# Patient Record
Sex: Female | Born: 1965 | Race: Black or African American | Hispanic: No | Marital: Married | Smoking: Never smoker
Health system: Southern US, Community
[De-identification: ages and names within clinical notes are randomized; demographics above are authoritative.]

## PROBLEM LIST (undated history)

## (undated) DIAGNOSIS — C50919 Malignant neoplasm of unspecified site of unspecified female breast: Secondary | ICD-10-CM

## (undated) DIAGNOSIS — I10 Essential (primary) hypertension: Secondary | ICD-10-CM

## (undated) DIAGNOSIS — D4860 Neoplasm of uncertain behavior of unspecified breast: Secondary | ICD-10-CM

## (undated) DIAGNOSIS — Z923 Personal history of irradiation: Secondary | ICD-10-CM

## (undated) HISTORY — DX: Malignant neoplasm of unspecified site of unspecified female breast: C50.919

## (undated) HISTORY — DX: Essential (primary) hypertension: I10

---

## 1998-05-25 ENCOUNTER — Encounter: Payer: Self-pay | Admitting: Obstetrics and Gynecology

## 1998-05-25 ENCOUNTER — Inpatient Hospital Stay (HOSPITAL_COMMUNITY): Admission: AD | Admit: 1998-05-25 | Discharge: 1998-05-25 | Payer: Self-pay | Admitting: Obstetrics and Gynecology

## 1998-06-02 ENCOUNTER — Inpatient Hospital Stay (HOSPITAL_COMMUNITY): Admission: AD | Admit: 1998-06-02 | Discharge: 1998-06-02 | Payer: Self-pay | Admitting: Obstetrics and Gynecology

## 1998-06-06 ENCOUNTER — Encounter: Payer: Self-pay | Admitting: Obstetrics and Gynecology

## 1998-06-06 ENCOUNTER — Inpatient Hospital Stay (HOSPITAL_COMMUNITY): Admission: AD | Admit: 1998-06-06 | Discharge: 1998-06-06 | Payer: Self-pay | Admitting: Obstetrics and Gynecology

## 1998-06-12 ENCOUNTER — Inpatient Hospital Stay (HOSPITAL_COMMUNITY): Admission: AD | Admit: 1998-06-12 | Discharge: 1998-06-12 | Payer: Self-pay | Admitting: *Deleted

## 1998-06-16 ENCOUNTER — Inpatient Hospital Stay (HOSPITAL_COMMUNITY): Admission: AD | Admit: 1998-06-16 | Discharge: 1998-06-19 | Payer: Self-pay | Admitting: Obstetrics and Gynecology

## 2000-05-19 ENCOUNTER — Other Ambulatory Visit: Admission: RE | Admit: 2000-05-19 | Discharge: 2000-05-19 | Payer: Self-pay | Admitting: *Deleted

## 2002-06-28 ENCOUNTER — Other Ambulatory Visit: Admission: RE | Admit: 2002-06-28 | Discharge: 2002-06-28 | Payer: Self-pay | Admitting: *Deleted

## 2005-07-22 ENCOUNTER — Encounter: Admission: RE | Admit: 2005-07-22 | Discharge: 2005-07-22 | Payer: Self-pay | Admitting: Obstetrics and Gynecology

## 2006-03-06 ENCOUNTER — Inpatient Hospital Stay (HOSPITAL_COMMUNITY): Admission: AD | Admit: 2006-03-06 | Discharge: 2006-03-10 | Payer: Self-pay | Admitting: *Deleted

## 2006-03-06 ENCOUNTER — Encounter (INDEPENDENT_AMBULATORY_CARE_PROVIDER_SITE_OTHER): Payer: Self-pay | Admitting: *Deleted

## 2006-03-11 ENCOUNTER — Encounter: Admission: RE | Admit: 2006-03-11 | Discharge: 2006-04-09 | Payer: Self-pay | Admitting: Obstetrics and Gynecology

## 2006-04-10 ENCOUNTER — Encounter: Admission: RE | Admit: 2006-04-10 | Discharge: 2006-05-10 | Payer: Self-pay | Admitting: Obstetrics and Gynecology

## 2006-05-11 ENCOUNTER — Encounter: Admission: RE | Admit: 2006-05-11 | Discharge: 2006-06-10 | Payer: Self-pay | Admitting: Obstetrics and Gynecology

## 2006-06-11 ENCOUNTER — Encounter: Admission: RE | Admit: 2006-06-11 | Discharge: 2006-07-10 | Payer: Self-pay | Admitting: Obstetrics and Gynecology

## 2006-07-11 ENCOUNTER — Encounter: Admission: RE | Admit: 2006-07-11 | Discharge: 2006-08-10 | Payer: Self-pay | Admitting: Obstetrics and Gynecology

## 2006-08-11 ENCOUNTER — Encounter: Admission: RE | Admit: 2006-08-11 | Discharge: 2006-09-10 | Payer: Self-pay | Admitting: Obstetrics and Gynecology

## 2009-01-09 ENCOUNTER — Other Ambulatory Visit: Admission: RE | Admit: 2009-01-09 | Discharge: 2009-01-09 | Payer: Self-pay | Admitting: Family Medicine

## 2009-01-17 ENCOUNTER — Encounter: Admission: RE | Admit: 2009-01-17 | Discharge: 2009-01-17 | Payer: Self-pay | Admitting: Family Medicine

## 2011-02-15 NOTE — Op Note (Signed)
Stacey Hinton, Stacey Hinton              ACCOUNT NO.:  [id number]   MEDICAL RECORD NO.:  [id number]          PATIENT TYPE:  INP   LOCATION:  9304                          FACILITY:  [facility]   PHYSICIAN:  Gerri Spore B. Earlene Plater, M.D.  DATE OF BIRTH:  January 07, 1966   DATE OF PROCEDURE:  03/06/2006  DATE OF DISCHARGE:                                 OPERATIVE REPORT   PREOPERATIVE DIAGNOSIS:  32-week pregnancy, probable abruption and  hemolysis, elevated liver enzymes, and low platelet count syndrome.   POSTOPERATIVE DIAGNOSIS:  32-week pregnancy, probable abruption and  hemolysis, elevated liver enzymes, and low platelet count syndrome.   PROCEDURE:  Repeat low transverse cesarean section.   SURGEON:  Chester Holstein. Earlene Plater, M.D.   ASSISTANT:  Marlinda Mike, C.N.M.   ANESTHESIA:  Spinal.   SPECIMENS:  Placenta to pathology.   ESTIMATED BLOOD LOSS:  600.   COMPLICATIONS:  None.   FINDINGS:  3 pound 13 ounce female in vertex presentation.  Apgars 1, 8, and  8.  The pH was 7.29.   INDICATIONS FOR PROCEDURE:  The patient presented to maternity admissions  this morning with complaints of low back pain intermittently and associated  bleeding.  She was noted to have a blood pressure of 220/110.  This was not  able to be reduced despite 40 and then 80 mg of IV Labetalol.  In addition,  her liver function tests were elevated, with her AST and ALT in the 90s.  Platelets were normal.  I recommended delivery.  We discussed the risks of  prematurity and surgical risks as outlined in the history and physical.   DESCRIPTION OF PROCEDURE:  The patient was taken to the operating room and  spinal anesthesia obtained.  She was prepped and draped in the standard  fashion, and a Foley catheter was inserted into the bladder.   A Pfannenstiel incision was made through the previous scar and carried  sharply to the fascia.  The fascia was divided sharply.  The posterior  sheath and peritoneum were elevated and entered  sharply.  The rectus muscles  were very tight and adherent to the abdominal wall.  Therefore, the left  rectus muscle was divided in its medial third with the Bovie.  Hemostasis  was obtained.  The bladder blade was inserted.  The bladder flap was created  with sharp dissection.  The uterine incision was made in low transverse  fashion with the knife, with clear fluid at amniotomy.  The incision was  extended laterally and superiorly at the lateral edges.  It was dif to  deliver the vertex given the small fetal size and tight maternal abdomen;  however, with additional uterine extension superiorly on the left edge, the  vertex was delivered.  The nose and mouth were suctioned with the bulb.  The  remainder of the infant delivered without difficulty.  A small clot,  approximately 10 cc, was noted at the inferior edge of the placenta.  The  placenta was removed manually.  The uterus was exteriorized and cleared of  all clots and debris.  The uterine incision was closed  in a running locked  stitch of 0 chromic.  A second layer was placed in an imbricating fashion.  A bleeder at the left edge was made hemostatic with additional figure-of-  eight stitch of the same suture.  The tubes and ovaries appeared normal.  There were a few scattered subcentimeter subserosal fibroids noted.   The uterus was returned to the abdomen.  The pelvis was irrigated.  The  uterus and bladder flap were hemostatic.  The rectus muscle was also  hemostatic.  The fascia was closed in a running stitch of 0 Vicryl.  The  subcutaneous tissue was irrigated and made hemostatic with the Bovie.  The  skin was closed with staples.   The patient tolerated the procedure well.  There were no complications.  She  was taken to the recovery room awake, alert, and in stable condition.  All  counts were correct per the operating room staff.      Gerri Spore B. Earlene Plater, M.D.  Electronically Signed     WBD/MEDQ  D:  03/06/2006  T:   03/06/2006  Job:  [id number]

## 2011-02-15 NOTE — Discharge Summary (Signed)
NAME:  Stacey Hinton, Stacey Hinton       ACCOUNT NO.:  .......................Marland Kitchen   MEDICAL RECORD NO.:  [id number]          PATIENT TYPE:  INP   LOCATION:  9318                          FACILITY:  [facility]   PHYSICIAN:  Gerri Spore B. Earlene Plater, M.D.  DATE OF BIRTH:  04/29/1966   DATE OF ADMISSION:  03/06/2006  DATE OF DISCHARGE:  03/10/2006                                 DISCHARGE SUMMARY   ADMISSION DIAGNOSES:  1.  A 32-week intrauterine pregnancy.  2.  Preeclampsia with HELLP syndrome.  3.  Abruptio placenta.   DISCHARGE DIAGNOSES:  1.  A 32-week intrauterine pregnancy.  2.  Preeclampsia with HELLP syndrome.  3.  Abruptio placenta.   OPERATION/PROCEDURE:  Repeat low transverse cesarean section.   HISTORY OF PRESENT ILLNESS:  A 45 year old African American female, gravida  2, para 1, [redacted] weeks pregnant, presented with complaints of low back pain and  associated bleeding.  Blood pressure at the time of admission 220/110.  This  did not respond to several doses of IV labetalol.  Also liver function tests  were elevated with an AST and ALT in the 90s with normal platelet counts.  I  recommended immediate delivery to the patient.   HOSPITAL COURSE:  The patient was admitted, underwent repeat low transverse  cesarean section with findings of a 3 pound 13 ounce female, vertex  presentation.  Apgars were 1 and 8, pH 7.29.  Findings at the time of  surgery also included a small clot, about 10 mL at the inferior edge of the  placenta.  Postoperatively, the patient was placed on magnesium sulfate for  24 hours.  She received prophylaxis, given a nicardipine drip for blood  pressure control and converted to oral labetalol.  She did diurese and labs  normalized throughout her hospital stay.  She was discharged home on the  fourth postoperative day in satisfactory condition.   DISCHARGE INSTRUCTIONS:  Standard preprinted instructions given prior to  dismissal.  In addition, the patient was given preeclamptic  precautions.   FAMILY HISTORY:  [facility] in one week.   DISCHARGE MEDICATIONS:  1.  Labetalol 100 mg p.o. b.i.d.  2.  Prenatal vitamin one p.o. daily.   CONDITION ON DISCHARGE:  Satisfactory.      Gerri Spore B. Earlene Plater, M.D.  Electronically Signed     WBD/MEDQ  D:  03/31/2006  T:  03/31/2006  Job:  [id number]

## 2011-05-28 ENCOUNTER — Other Ambulatory Visit: Payer: Self-pay | Admitting: Obstetrics and Gynecology

## 2011-05-28 DIAGNOSIS — Z1231 Encounter for screening mammogram for malignant neoplasm of breast: Secondary | ICD-10-CM

## 2011-06-06 ENCOUNTER — Ambulatory Visit
Admission: RE | Admit: 2011-06-06 | Discharge: 2011-06-06 | Disposition: A | Discharge status: Home / Self Care | Payer: BC Managed Care – PPO | Source: Ambulatory Visit | Attending: Obstetrics and Gynecology | Admitting: Obstetrics and Gynecology

## 2011-06-06 DIAGNOSIS — Z1231 Encounter for screening mammogram for malignant neoplasm of breast: Secondary | ICD-10-CM

## 2011-06-07 ENCOUNTER — Other Ambulatory Visit: Payer: Self-pay | Admitting: Obstetrics and Gynecology

## 2011-06-07 DIAGNOSIS — R928 Other abnormal and inconclusive findings on diagnostic imaging of breast: Secondary | ICD-10-CM

## 2011-06-14 ENCOUNTER — Other Ambulatory Visit: Payer: Self-pay | Admitting: Obstetrics and Gynecology

## 2011-06-14 ENCOUNTER — Ambulatory Visit
Admission: RE | Admit: 2011-06-14 | Discharge: 2011-06-14 | Disposition: A | Discharge status: Home / Self Care | Payer: BC Managed Care – PPO | Source: Ambulatory Visit | Attending: Obstetrics and Gynecology | Admitting: Obstetrics and Gynecology

## 2011-06-14 DIAGNOSIS — R928 Other abnormal and inconclusive findings on diagnostic imaging of breast: Secondary | ICD-10-CM

## 2011-08-28 ENCOUNTER — Other Ambulatory Visit: Payer: Self-pay | Admitting: Obstetrics and Gynecology

## 2011-08-28 DIAGNOSIS — N63 Unspecified lump in unspecified breast: Secondary | ICD-10-CM

## 2011-09-05 ENCOUNTER — Other Ambulatory Visit: Payer: BC Managed Care – PPO

## 2011-09-09 ENCOUNTER — Ambulatory Visit
Admission: RE | Admit: 2011-09-09 | Discharge: 2011-09-09 | Disposition: A | Discharge status: Home / Self Care | Payer: BC Managed Care – PPO | Source: Ambulatory Visit | Attending: Obstetrics and Gynecology | Admitting: Obstetrics and Gynecology

## 2011-09-09 DIAGNOSIS — N63 Unspecified lump in unspecified breast: Secondary | ICD-10-CM

## 2012-09-07 ENCOUNTER — Other Ambulatory Visit: Payer: Self-pay | Admitting: Obstetrics and Gynecology

## 2012-09-07 DIAGNOSIS — Z1231 Encounter for screening mammogram for malignant neoplasm of breast: Secondary | ICD-10-CM

## 2012-10-12 ENCOUNTER — Ambulatory Visit
Admission: RE | Admit: 2012-10-12 | Discharge: 2012-10-12 | Disposition: A | Discharge status: Home / Self Care | Payer: BC Managed Care – PPO | Source: Ambulatory Visit | Attending: Obstetrics and Gynecology | Admitting: Obstetrics and Gynecology

## 2012-10-12 DIAGNOSIS — Z1231 Encounter for screening mammogram for malignant neoplasm of breast: Secondary | ICD-10-CM

## 2013-08-31 ENCOUNTER — Other Ambulatory Visit: Payer: Self-pay

## 2013-08-31 DIAGNOSIS — Z1231 Encounter for screening mammogram for malignant neoplasm of breast: Secondary | ICD-10-CM

## 2013-09-30 HISTORY — PX: BREAST LUMPECTOMY: SHX2

## 2013-10-13 ENCOUNTER — Ambulatory Visit: Payer: BC Managed Care – PPO

## 2013-10-14 ENCOUNTER — Ambulatory Visit
Admission: RE | Admit: 2013-10-14 | Discharge: 2013-10-14 | Disposition: A | Discharge status: Home / Self Care | Payer: BC Managed Care – PPO | Source: Ambulatory Visit

## 2013-10-14 DIAGNOSIS — Z1231 Encounter for screening mammogram for malignant neoplasm of breast: Secondary | ICD-10-CM

## 2014-02-09 ENCOUNTER — Ambulatory Visit (INDEPENDENT_AMBULATORY_CARE_PROVIDER_SITE_OTHER): Payer: BC Managed Care – PPO | Admitting: General Surgery

## 2014-02-09 ENCOUNTER — Encounter (INDEPENDENT_AMBULATORY_CARE_PROVIDER_SITE_OTHER): Payer: Self-pay

## 2014-02-09 ENCOUNTER — Encounter (INDEPENDENT_AMBULATORY_CARE_PROVIDER_SITE_OTHER): Payer: Self-pay | Admitting: General Surgery

## 2014-02-09 VITALS — BP 150/98 | HR 80 | Temp 97.6°F | Resp 12 | Ht 67.5 in | Wt 155.0 lb

## 2014-02-09 DIAGNOSIS — N63 Unspecified lump in unspecified breast: Secondary | ICD-10-CM

## 2014-02-09 NOTE — Progress Notes (Signed)
Patient ID: Stacey Hinton, female   DOB: April 06, 1966, 48 y.o.   MRN: [id number]  Chief Complaint  Patient presents with  . New Evaluation    eval rt breast mass    HPI Stacey Hinton is a 48 y.o. female.   HPI  She is referred by Dr. Ronita Hipps because of an enlarging right breast mass.  This is been present for a number of years. In 2012, a needle biopsy was performed and was consistent with fibroadenoma. The mass was 2.3 cm at that time. She is noticed it growing and  tenting up the skin in the right lateral breast.  No pain with this. No nipple discharge. Age at menarche was 21, age at first live birth was greater than 7, she is still having menstrual periods, no family history of breast cancer.  Past Medical History  Diagnosis Date  . Hypertension     Past Surgical History  Procedure Laterality Date  . Cesarean section  06/16/98 03/06/06    Family History  Problem Relation Age of Onset  . Cancer Maternal Aunt 109    breast    Social History History  Substance Use Topics  . Smoking status: Never Smoker   . Smokeless tobacco: Never Used  . Alcohol Use: Yes     Comment: occ    No Known Allergies  Current Outpatient Prescriptions  Medication Sig Dispense Refill  . benazepril (LOTENSIN) 5 MG tablet Take 5 mg by mouth daily.      Marland Kitchen losartan (COZAAR) 25 MG tablet Take 25 mg by mouth daily.       No current facility-administered medications for this visit.    Review of Systems Review of Systems  Constitutional: Negative.   HENT: Negative.   Respiratory: Negative.   Cardiovascular: Negative.   Gastrointestinal: Negative.   Genitourinary: Negative.   Neurological: Negative.   Hematological: Negative.     Blood pressure 150/98, pulse 80, temperature 97.6 F (36.4 C), temperature source Temporal, resp. rate 12, height 5' 7.5" (1.715 m), weight 155 lb (70.308 kg).  Physical Exam Physical Exam  Constitutional: She appears well-developed and well-nourished. No distress.   HENT:  Head: Normocephalic and atraumatic.  Eyes: No scleral icterus.  Neck: Neck supple.  Pulmonary/Chest:  Breast asymmetry is present with the left breast being larger than the right (should she states this is always been the case). There is a 7 cm x 7 cm right breast mass laterally with tenting up of the skin. No inflammatory changes. The left breast is without masses or suspicious skin changes.  Musculoskeletal:  No supraclavicular or axillary adenopathy   Lymphadenopathy:    She has no cervical adenopathy.  Neurological: She is alert.  Skin: Skin is warm and dry.  Psychiatric: She has a normal mood and affect. Her behavior is normal.    Data Reviewed Mammograms. Previous pathology report. Note from Dr. Ronita Hipps.  Assessment    Enlarging right breast mass which previously was proven to be a fibroadenoma 2-1/2 years ago. It is now tenting up the skin. It could be an enlarging fibroadenoma or a new process.     Plan    I want to schedule a large core needle biopsy for her in the office. I did recommend eventual removal however there is a good chance this could lead to cosmetic deformity which the may later need to be corrected with plastic surgery. We will schedule her to come back to the office to have the large core  needle biopsy done.        Rhunette Croft Amiera Herzberg 02/09/2014, 10:25 AM

## 2014-02-09 NOTE — Patient Instructions (Signed)
Please call us after you have looked at your schedule so we can schedule the needle biopsy.

## 2014-02-14 ENCOUNTER — Ambulatory Visit (INDEPENDENT_AMBULATORY_CARE_PROVIDER_SITE_OTHER): Payer: BC Managed Care – PPO | Admitting: General Surgery

## 2014-02-14 ENCOUNTER — Other Ambulatory Visit (INDEPENDENT_AMBULATORY_CARE_PROVIDER_SITE_OTHER): Payer: Self-pay | Admitting: General Surgery

## 2014-02-14 ENCOUNTER — Encounter (INDEPENDENT_AMBULATORY_CARE_PROVIDER_SITE_OTHER): Payer: Self-pay | Admitting: General Surgery

## 2014-02-14 VITALS — BP 138/80 | HR 72 | Resp 14 | Ht 67.5 in | Wt 157.8 lb

## 2014-02-14 DIAGNOSIS — N6310 Unspecified lump in the right breast, unspecified quadrant: Secondary | ICD-10-CM

## 2014-02-14 DIAGNOSIS — N63 Unspecified lump in unspecified breast: Secondary | ICD-10-CM

## 2014-02-14 NOTE — Patient Instructions (Signed)
Light activities for the rest of the day. Apply ice to area as much as possible. Call if you have heavy bleeding. Keep the bandage dry. May remove bandage in 2 days and get area wet.

## 2014-02-14 NOTE — Progress Notes (Signed)
She presents today for large core needle biopsy of the growing right breast mass. The area in the right breast that was tenting up the skin was sterilely prepped and anesthetized with Xylocaine. A small incision was made in the overlying skin using  # 11 blade scalpel. Using a large core needle 3 biopsies were taken. These were placed in formalin. Bleeding was controlled with direct pressure and a single 4-0 Monocryl stitch to close the skin incision. A bulky dressing was applied. She tolerated the procedure well. She was given discharge instructions and we will call her with the pathology results.

## 2014-02-18 ENCOUNTER — Encounter (INDEPENDENT_AMBULATORY_CARE_PROVIDER_SITE_OTHER): Payer: Self-pay | Admitting: General Surgery

## 2014-02-18 NOTE — Progress Notes (Signed)
Patient ID: Stacey Hinton, female   DOB: [birth year], 48 y.o.   MRN: [id number] I spoke with her regarding her pathology which is below.  I told her that this area needs to be removed and very likely leave a cosmetic deformity. I suggested a referral to a plastic surgeon preoperatively and she is in agreement with that. When she sees a Psychiatric nurse and talks about potential for reconstruction, we can then schedule her surgery  .Breast, right, needle core biopsy, tissue - BIPHASIC STROMAL EPITHELIAL LESION. - SEE COMMENT. Microscopic Comment Based on the tissue examined, the differential includes a fibroadenoma versus a phyllodes tumor. A phyllodes tumor is favored, however, excision is recommended for definitive characterization.

## 2014-02-22 ENCOUNTER — Other Ambulatory Visit (INDEPENDENT_AMBULATORY_CARE_PROVIDER_SITE_OTHER): Payer: Self-pay | Admitting: General Surgery

## 2014-02-22 DIAGNOSIS — D4860 Neoplasm of uncertain behavior of unspecified breast: Secondary | ICD-10-CM

## 2014-05-16 ENCOUNTER — Telehealth (INDEPENDENT_AMBULATORY_CARE_PROVIDER_SITE_OTHER): Payer: Self-pay

## 2014-05-16 ENCOUNTER — Encounter (INDEPENDENT_AMBULATORY_CARE_PROVIDER_SITE_OTHER): Payer: Self-pay | Admitting: General Surgery

## 2014-05-16 NOTE — Telephone Encounter (Signed)
Spoke with her and have put orders in for her surgery.  Will coordinate with Dr. [name].

## 2014-05-16 NOTE — Telephone Encounter (Signed)
Pt states that she was returning Dr Zella Richer phone call from Friday. Pt states that she has been to see Dr [name] and pt is ready to schedule sx at this point. Pt would like to speak with Dr Zella Richer. Informed pt that I would send Dr Zella Richer a message. Pt verbalized understanding

## 2014-05-16 NOTE — Progress Notes (Signed)
Patient ID: Stacey Hinton, female   DOB: [birth year], 48 y.o.   MRN: [id number] I spoke with her today.  She is ready to schedule her surgery-Right partial mastectomy(most likely quadrantectomy) with closure by Dr. [name].  She had some insurance issues earlier this summer but those have been resolved.  Her daughter's 16th birthday is in mid-September and she would like to schedule the operation after that.

## 2014-06-17 ENCOUNTER — Other Ambulatory Visit: Payer: Self-pay | Admitting: Plastic Surgery

## 2014-06-23 ENCOUNTER — Encounter (HOSPITAL_COMMUNITY): Payer: Self-pay

## 2014-06-24 ENCOUNTER — Ambulatory Visit (HOSPITAL_COMMUNITY)
Admission: RE | Admit: 2014-06-24 | Discharge: 2014-06-24 | Disposition: A | Discharge status: Home / Self Care | Payer: BC Managed Care – PPO | Source: Ambulatory Visit | Attending: Anesthesiology | Admitting: Anesthesiology

## 2014-06-24 ENCOUNTER — Encounter (HOSPITAL_COMMUNITY): Payer: Self-pay

## 2014-06-24 ENCOUNTER — Encounter (HOSPITAL_COMMUNITY)
Admission: RE | Admit: 2014-06-24 | Discharge: 2014-06-24 | Disposition: A | Discharge status: Home / Self Care | Payer: BC Managed Care – PPO | Source: Ambulatory Visit | Attending: Plastic Surgery | Admitting: Plastic Surgery

## 2014-06-24 DIAGNOSIS — I10 Essential (primary) hypertension: Secondary | ICD-10-CM | POA: Insufficient documentation

## 2014-06-24 DIAGNOSIS — N63 Unspecified lump in unspecified breast: Secondary | ICD-10-CM | POA: Insufficient documentation

## 2014-06-24 DIAGNOSIS — Z01818 Encounter for other preprocedural examination: Secondary | ICD-10-CM | POA: Diagnosis not present

## 2014-06-24 LAB — CBC WITH DIFFERENTIAL/PLATELET
Basophils Absolute: 0 10*3/uL (ref 0.0–0.1)
Basophils Relative: 0 % (ref 0–1)
EOS PCT: 2 % (ref 0–5)
Eosinophils Absolute: 0.1 10*3/uL (ref 0.0–0.7)
HEMATOCRIT: 36.7 % (ref 36.0–46.0)
HEMOGLOBIN: 12.4 g/dL (ref 12.0–15.0)
LYMPHS ABS: 2.3 10*3/uL (ref 0.7–4.0)
LYMPHS PCT: 43 % (ref 12–46)
MCH: 28 pg (ref 26.0–34.0)
MCHC: 33.8 g/dL (ref 30.0–36.0)
MCV: 82.8 fL (ref 78.0–100.0)
MONOS PCT: 8 % (ref 3–12)
Monocytes Absolute: 0.4 10*3/uL (ref 0.1–1.0)
Neutro Abs: 2.5 10*3/uL (ref 1.7–7.7)
Neutrophils Relative %: 47 % (ref 43–77)
Platelets: 329 10*3/uL (ref 150–400)
RBC: 4.43 MIL/uL (ref 3.87–5.11)
RDW: 13 % (ref 11.5–15.5)
WBC: 5.4 10*3/uL (ref 4.0–10.5)

## 2014-06-24 LAB — COMPREHENSIVE METABOLIC PANEL
ALK PHOS: 82 U/L (ref 39–117)
ALT: 11 U/L (ref 0–35)
ANION GAP: 11 (ref 5–15)
AST: 16 U/L (ref 0–37)
Albumin: 3.9 g/dL (ref 3.5–5.2)
BUN: 6 mg/dL (ref 6–23)
CO2: 26 meq/L (ref 19–32)
Calcium: 8.8 mg/dL (ref 8.4–10.5)
Chloride: 101 mEq/L (ref 96–112)
Creatinine, Ser: 0.54 mg/dL (ref 0.50–1.10)
GFR calc non Af Amer: >90 mL/min (ref >90)
GLUCOSE: 88 mg/dL (ref 70–99)
POTASSIUM: 3.6 meq/L — AB (ref 3.7–5.3)
Sodium: 138 mEq/L (ref 137–147)
TOTAL PROTEIN: 7.7 g/dL (ref 6.0–8.3)
Total Bilirubin: 0.2 mg/dL — ABNORMAL LOW (ref 0.3–1.2)

## 2014-06-24 LAB — PROTIME-INR
INR: 1.1 (ref 0.00–1.49)
Prothrombin Time: 14.2 seconds (ref 11.6–15.2)

## 2014-06-24 LAB — HCG, SERUM, QUALITATIVE: PREG SERUM: NEGATIVE

## 2014-06-24 NOTE — Pre-Procedure Instructions (Addendum)
Stacey Hinton  06/24/2014   Your procedure is scheduled on:  Monday October 5.  Report to [facility] Admitting at 5:30AM.  Call this number if you have problems the morning of surgery: [phone]   Remember:   Do not eat food or drink liquids after midnight Sunday, October 4.  Take these medicines the morning of surgery with A SIP OF WATER: amLODipine (NORVASC).              Stop taking Aspirin, Coumadin, Plavix, Effient and Herbal medications.  Don not take any NSAIDs ie: Ibuprofen,  Advil,Naproxen or any medication containing Aspirin.   Do not wear jewelry, make-up or nail polish.  Do not wear lotions, powders, or perfumes.  Do not shave 48 hours prior to surgery.                                                                                                                              Do not bring valuables to the hospital.              [facility] is not responsible for any belongings or valuables.               Contacts, dentures or bridgework may not be worn into surgery.  Leave suitcase in the car. After surgery it may be brought to your room.  For patients admitted to the hospital, discharge time is determined by your treatment team.               Patients discharged the day of surgery will not be allowed to drivhome.  Name and phone number of your driver: :   Special Instructions: Stop taking Aspirin, Coumadin, Plavix, Effient and Herbal medications.  Do not take any NSAIDs ie: Ibuprofen,  Advil,Naproxen or any medication containing Aspirin.   Please read over the following fact sheets that you were given: Pain Booklet, Coughing and Deep Breathing and Surgical Site Infection Prevention

## 2014-07-03 MED ORDER — CHLORHEXIDINE GLUCONATE 4 % EX LIQD
1.0000 "application " | Freq: Once | CUTANEOUS | Status: DC
  Filled 2014-07-03: qty 15

## 2014-07-03 MED ORDER — CEFAZOLIN SODIUM-DEXTROSE 2-3 GM-% IV SOLR
2.0000 g | INTRAVENOUS | Status: AC
  Administered 2014-07-04 (×2): 2 g via INTRAVENOUS
  Filled 2014-07-03: qty 50

## 2014-07-03 MED ORDER — HEPARIN SODIUM (PORCINE) 5000 UNIT/ML IJ SOLN
5000.0000 [IU] | Freq: Once | INTRAMUSCULAR | Status: AC
  Administered 2014-07-04: 5000 [IU] via SUBCUTANEOUS
  Filled 2014-07-03: qty 1

## 2014-07-04 ENCOUNTER — Inpatient Hospital Stay (HOSPITAL_COMMUNITY)
Admission: RE | Admit: 2014-07-04 | Discharge: 2014-07-06 | DRG: 583 | Disposition: A | Discharge status: Home / Self Care | Payer: BC Managed Care – PPO | Source: Ambulatory Visit | Attending: Plastic Surgery | Admitting: Plastic Surgery

## 2014-07-04 ENCOUNTER — Encounter (HOSPITAL_COMMUNITY)
Admission: RE | Disposition: A | Discharge status: Home / Self Care | Payer: Self-pay | Source: Ambulatory Visit | Attending: Plastic Surgery

## 2014-07-04 ENCOUNTER — Encounter (HOSPITAL_COMMUNITY): Payer: Self-pay | Admitting: Surgery

## 2014-07-04 ENCOUNTER — Encounter (HOSPITAL_COMMUNITY): Payer: BC Managed Care – PPO | Admitting: Certified Registered Nurse Anesthetist

## 2014-07-04 ENCOUNTER — Ambulatory Visit (HOSPITAL_COMMUNITY): Payer: BC Managed Care – PPO | Admitting: Certified Registered Nurse Anesthetist

## 2014-07-04 DIAGNOSIS — I10 Essential (primary) hypertension: Secondary | ICD-10-CM | POA: Diagnosis present

## 2014-07-04 DIAGNOSIS — D4861 Neoplasm of uncertain behavior of right breast: Principal | ICD-10-CM | POA: Diagnosis present

## 2014-07-04 DIAGNOSIS — D4860 Neoplasm of uncertain behavior of unspecified breast: Secondary | ICD-10-CM

## 2014-07-04 DIAGNOSIS — N63 Unspecified lump in unspecified breast: Secondary | ICD-10-CM | POA: Diagnosis present

## 2014-07-04 HISTORY — DX: Neoplasm of uncertain behavior of unspecified breast: D48.60

## 2014-07-04 HISTORY — PX: LATISSIMUS FLAP TO BREAST: SHX5357

## 2014-07-04 HISTORY — PX: MASTECTOMY, PARTIAL: SHX709

## 2014-07-04 SURGERY — RECONSTRUCTION, BREAST, USING LATISSIMUS DORSI MYOCUTANEOUS FLAP
Anesthesia: General | Site: Breast | Laterality: Right

## 2014-07-04 MED ORDER — FENTANYL CITRATE 0.05 MG/ML IJ SOLN
INTRAMUSCULAR | Status: AC
  Filled 2014-07-04: qty 5

## 2014-07-04 MED ORDER — INFLUENZA VAC SPLIT QUAD 0.5 ML IM SUSY
0.5000 mL | PREFILLED_SYRINGE | INTRAMUSCULAR | Status: AC
  Administered 2014-07-05: 0.5 mL via INTRAMUSCULAR
  Filled 2014-07-04: qty 0.5

## 2014-07-04 MED ORDER — LIDOCAINE HCL (CARDIAC) 20 MG/ML IV SOLN
INTRAVENOUS | Status: DC | PRN
  Administered 2014-07-04: 60 mg via INTRAVENOUS

## 2014-07-04 MED ORDER — DIPHENHYDRAMINE HCL 50 MG/ML IJ SOLN: 12.5000 mg | Freq: Four times a day (QID) | INTRAMUSCULAR | Status: DC | PRN

## 2014-07-04 MED ORDER — HYDROMORPHONE 0.3 MG/ML IV SOLN
INTRAVENOUS | Status: AC
  Filled 2014-07-04: qty 25

## 2014-07-04 MED ORDER — 0.9 % SODIUM CHLORIDE (POUR BTL) OPTIME
TOPICAL | Status: DC | PRN
  Administered 2014-07-04 (×3): 1000 mL

## 2014-07-04 MED ORDER — PROMETHAZINE HCL 25 MG/ML IJ SOLN: 6.2500 mg | INTRAMUSCULAR | Status: DC | PRN

## 2014-07-04 MED ORDER — LACTATED RINGERS IV SOLN
INTRAVENOUS | Status: DC | PRN
  Administered 2014-07-04 (×4): via INTRAVENOUS

## 2014-07-04 MED ORDER — ESMOLOL HCL 10 MG/ML IV SOLN
INTRAVENOUS | Status: DC | PRN
  Administered 2014-07-04 (×2): 10 mg via INTRAVENOUS

## 2014-07-04 MED ORDER — HYDROMORPHONE HCL 1 MG/ML IJ SOLN
0.2500 mg | INTRAMUSCULAR | Status: DC | PRN
  Administered 2014-07-04 (×2): 0.5 mg via INTRAVENOUS

## 2014-07-04 MED ORDER — PROPOFOL 10 MG/ML IV BOLUS
INTRAVENOUS | Status: AC
  Filled 2014-07-04: qty 20

## 2014-07-04 MED ORDER — ROCURONIUM BROMIDE 50 MG/5ML IV SOLN
INTRAVENOUS | Status: AC
  Filled 2014-07-04: qty 1

## 2014-07-04 MED ORDER — AMLODIPINE BESYLATE 5 MG PO TABS
5.0000 mg | ORAL_TABLET | Freq: Every day | ORAL | Status: DC
  Administered 2014-07-05 – 2014-07-06 (×2): 5 mg via ORAL
  Filled 2014-07-04 (×3): qty 1

## 2014-07-04 MED ORDER — MIDAZOLAM HCL 5 MG/5ML IJ SOLN
INTRAMUSCULAR | Status: DC | PRN
  Administered 2014-07-04: 2 mg via INTRAVENOUS

## 2014-07-04 MED ORDER — GLYCOPYRROLATE 0.2 MG/ML IJ SOLN
INTRAMUSCULAR | Status: DC | PRN
  Administered 2014-07-04: 0.4 mg via INTRAVENOUS

## 2014-07-04 MED ORDER — HYDROMORPHONE HCL 1 MG/ML IJ SOLN
INTRAMUSCULAR | Status: AC
  Filled 2014-07-04: qty 1

## 2014-07-04 MED ORDER — EPHEDRINE SULFATE 50 MG/ML IJ SOLN
INTRAMUSCULAR | Status: AC
  Filled 2014-07-04: qty 1

## 2014-07-04 MED ORDER — ONDANSETRON HCL 4 MG/2ML IJ SOLN: 4.0000 mg | Freq: Four times a day (QID) | INTRAMUSCULAR | Status: DC | PRN

## 2014-07-04 MED ORDER — HYDROMORPHONE 0.3 MG/ML IV SOLN
INTRAVENOUS | Status: DC
  Administered 2014-07-04: 0.4 mg via INTRAVENOUS
  Administered 2014-07-04: 0.399 mg via INTRAVENOUS
  Administered 2014-07-04: 15:00:00 via INTRAVENOUS
  Administered 2014-07-05: 0.4 mg via INTRAVENOUS

## 2014-07-04 MED ORDER — NEOSTIGMINE METHYLSULFATE 10 MG/10ML IV SOLN
INTRAVENOUS | Status: DC | PRN
Start: 2014-07-04 — End: 2014-07-04
  Administered 2014-07-04: 3 mg via INTRAVENOUS

## 2014-07-04 MED ORDER — ONDANSETRON HCL 4 MG/2ML IJ SOLN
INTRAMUSCULAR | Status: DC | PRN
  Administered 2014-07-04: 4 mg via INTRAVENOUS

## 2014-07-04 MED ORDER — OXYCODONE HCL 5 MG/5ML PO SOLN: 5.0000 mg | Freq: Once | ORAL | Status: DC | PRN

## 2014-07-04 MED ORDER — SODIUM CHLORIDE 0.9 % IV SOLN
INTRAVENOUS | Status: AC
  Administered 2014-07-04: 09:00:00
  Filled 2014-07-04: qty 1

## 2014-07-04 MED ORDER — ONDANSETRON HCL 4 MG/2ML IJ SOLN
INTRAMUSCULAR | Status: AC
  Filled 2014-07-04: qty 2

## 2014-07-04 MED ORDER — HEPARIN SODIUM (PORCINE) 5000 UNIT/ML IJ SOLN
5000.0000 [IU] | Freq: Three times a day (TID) | INTRAMUSCULAR | Status: DC
  Administered 2014-07-05 – 2014-07-06 (×4): 5000 [IU] via SUBCUTANEOUS
  Filled 2014-07-04 (×8): qty 1

## 2014-07-04 MED ORDER — FENTANYL CITRATE 0.05 MG/ML IJ SOLN
INTRAMUSCULAR | Status: DC | PRN
  Administered 2014-07-04: 100 ug via INTRAVENOUS
  Administered 2014-07-04 (×5): 50 ug via INTRAVENOUS
  Administered 2014-07-04: 100 ug via INTRAVENOUS
  Administered 2014-07-04: 50 ug via INTRAVENOUS

## 2014-07-04 MED ORDER — METHOCARBAMOL 500 MG PO TABS
500.0000 mg | ORAL_TABLET | Freq: Four times a day (QID) | ORAL | Status: DC | PRN
  Administered 2014-07-05 – 2014-07-06 (×2): 500 mg via ORAL
  Filled 2014-07-04 (×2): qty 1

## 2014-07-04 MED ORDER — CEFAZOLIN SODIUM-DEXTROSE 2-3 GM-% IV SOLR
INTRAVENOUS | Status: AC
  Filled 2014-07-04: qty 50

## 2014-07-04 MED ORDER — CEFAZOLIN SODIUM 1-5 GM-% IV SOLN
1.0000 g | Freq: Four times a day (QID) | INTRAVENOUS | Status: DC
  Administered 2014-07-04 – 2014-07-06 (×7): 1 g via INTRAVENOUS
  Filled 2014-07-04 (×9): qty 50

## 2014-07-04 MED ORDER — SUCCINYLCHOLINE CHLORIDE 20 MG/ML IJ SOLN
INTRAMUSCULAR | Status: AC
  Filled 2014-07-04: qty 1

## 2014-07-04 MED ORDER — MIDAZOLAM HCL 2 MG/2ML IJ SOLN
INTRAMUSCULAR | Status: AC
  Filled 2014-07-04: qty 2

## 2014-07-04 MED ORDER — PHENYLEPHRINE HCL 10 MG/ML IJ SOLN
INTRAMUSCULAR | Status: DC | PRN
  Administered 2014-07-04 (×2): 40 ug via INTRAVENOUS
  Administered 2014-07-04: 80 ug via INTRAVENOUS
  Administered 2014-07-04: 40 ug via INTRAVENOUS
  Administered 2014-07-04: 80 ug via INTRAVENOUS

## 2014-07-04 MED ORDER — SODIUM CHLORIDE 0.9 % IJ SOLN
INTRAMUSCULAR | Status: AC
  Filled 2014-07-04: qty 10

## 2014-07-04 MED ORDER — DIPHENHYDRAMINE HCL 12.5 MG/5ML PO ELIX: 12.5000 mg | ORAL_SOLUTION | Freq: Four times a day (QID) | ORAL | Status: DC | PRN

## 2014-07-04 MED ORDER — OXYCODONE HCL 5 MG PO TABS: 5.0000 mg | ORAL_TABLET | Freq: Once | ORAL | Status: DC | PRN

## 2014-07-04 MED ORDER — DOCUSATE SODIUM 100 MG PO CAPS
100.0000 mg | ORAL_CAPSULE | Freq: Every day | ORAL | Status: DC
  Administered 2014-07-04 – 2014-07-06 (×3): 100 mg via ORAL
  Filled 2014-07-04 (×3): qty 1

## 2014-07-04 MED ORDER — NALOXONE HCL 0.4 MG/ML IJ SOLN: 0.4000 mg | INTRAMUSCULAR | Status: DC | PRN

## 2014-07-04 MED ORDER — LIDOCAINE HCL (CARDIAC) 20 MG/ML IV SOLN
INTRAVENOUS | Status: AC
  Filled 2014-07-04: qty 5

## 2014-07-04 MED ORDER — DEXTROSE-NACL 5-0.45 % IV SOLN
INTRAVENOUS | Status: DC
  Administered 2014-07-04 (×2): via INTRAVENOUS

## 2014-07-04 MED ORDER — SODIUM CHLORIDE 0.9 % IJ SOLN: 9.0000 mL | INTRAMUSCULAR | Status: DC | PRN

## 2014-07-04 MED ORDER — LOSARTAN POTASSIUM 50 MG PO TABS
50.0000 mg | ORAL_TABLET | Freq: Every day | ORAL | Status: DC
  Administered 2014-07-04 – 2014-07-06 (×3): 50 mg via ORAL
  Filled 2014-07-04 (×4): qty 1

## 2014-07-04 MED ORDER — PROPOFOL 10 MG/ML IV BOLUS
INTRAVENOUS | Status: DC | PRN
  Administered 2014-07-04: 50 mg via INTRAVENOUS
  Administered 2014-07-04: 200 mg via INTRAVENOUS
  Administered 2014-07-04: 100 mg via INTRAVENOUS

## 2014-07-04 MED ORDER — ROCURONIUM BROMIDE 100 MG/10ML IV SOLN
INTRAVENOUS | Status: DC | PRN
  Administered 2014-07-04: 50 mg via INTRAVENOUS
  Administered 2014-07-04: 30 mg via INTRAVENOUS
  Administered 2014-07-04 (×2): 10 mg via INTRAVENOUS

## 2014-07-04 SURGICAL SUPPLY — 98 items
ADH SKN CLS APL DERMABOND .7 (GAUZE/BANDAGES/DRESSINGS) ×1
APL SKNCLS STERI-STRIP NONHPOA (GAUZE/BANDAGES/DRESSINGS)
APPLIER CLIP 9.375 MED OPEN (MISCELLANEOUS) ×6
APR CLP MED 9.3 20 MLT OPN (MISCELLANEOUS) ×2
ATCH SMKEVC FLXB CAUT HNDSWH (FILTER) ×2 IMPLANT
BAG DECANTER FOR FLEXI CONT (MISCELLANEOUS) ×3 IMPLANT
BANDAGE ELASTIC 6 VELCRO ST LF (GAUZE/BANDAGES/DRESSINGS) ×1 IMPLANT
BENZOIN TINCTURE PRP APPL 2/3 (GAUZE/BANDAGES/DRESSINGS) ×1 IMPLANT
BINDER BREAST LRG (GAUZE/BANDAGES/DRESSINGS) ×2 IMPLANT
BINDER BREAST XLRG (GAUZE/BANDAGES/DRESSINGS) IMPLANT
BINDER BREAST XXLRG (GAUZE/BANDAGES/DRESSINGS) IMPLANT
BIOPATCH RED 1 DISK 7.0 (GAUZE/BANDAGES/DRESSINGS) ×5 IMPLANT
BIOPATCH RED 1IN DISK 7.0MM (GAUZE/BANDAGES/DRESSINGS) ×3
BLADE 10 SAFETY STRL DISP (BLADE) ×1 IMPLANT
BLADE CLIPPER SURG (BLADE) ×2 IMPLANT
BLADE SURG 10 STRL SS (BLADE) ×2 IMPLANT
BLADE SURG 15 STRL LF DISP TIS (BLADE) ×1 IMPLANT
BLADE SURG 15 STRL SS (BLADE) ×6
CANISTER SUCTION 2500CC (MISCELLANEOUS) ×3 IMPLANT
CHLORAPREP W/TINT 26ML (MISCELLANEOUS) ×6 IMPLANT
CLEANER TIP ELECTROSURG 2X2 (MISCELLANEOUS) ×3 IMPLANT
CLIP APPLIE 9.375 MED OPEN (MISCELLANEOUS) ×1 IMPLANT
CLOSURE WOUND 1/2 X4 (GAUZE/BANDAGES/DRESSINGS)
CONT SPEC 4OZ CLIKSEAL STRL BL (MISCELLANEOUS) IMPLANT
COVER SURGICAL LIGHT HANDLE (MISCELLANEOUS) ×6 IMPLANT
DECANTER SPIKE VIAL GLASS SM (MISCELLANEOUS) ×2 IMPLANT
DERMABOND ADVANCED (GAUZE/BANDAGES/DRESSINGS) ×2
DERMABOND ADVANCED .7 DNX12 (GAUZE/BANDAGES/DRESSINGS) ×3 IMPLANT
DEVICE DUBIN SPECIMEN MAMMOGRA (MISCELLANEOUS) IMPLANT
DRAIN CHANNEL 19F RND (DRAIN) ×8 IMPLANT
DRAPE INCISE 23X17 IOBAN STRL (DRAPES) ×4
DRAPE INCISE 23X17 STRL (DRAPES) ×1 IMPLANT
DRAPE INCISE IOBAN 23X17 STRL (DRAPES) ×2 IMPLANT
DRAPE ORTHO SPLIT 77X108 STRL (DRAPES) ×12
DRAPE PED LAPAROTOMY (DRAPES) ×1 IMPLANT
DRAPE PROXIMA HALF (DRAPES) ×6 IMPLANT
DRAPE SURG 17X23 STRL (DRAPES) ×4 IMPLANT
DRAPE SURG ORHT 6 SPLT 77X108 (DRAPES) ×4 IMPLANT
DRAPE UTILITY 15X26 W/TAPE STR (DRAPE) ×10 IMPLANT
DRAPE WARM FLUID 44X44 (DRAPE) ×3 IMPLANT
DRSG PAD ABDOMINAL 8X10 ST (GAUZE/BANDAGES/DRESSINGS) ×2 IMPLANT
DRSG SORBAVIEW 3.5X5-5/16 MED (GAUZE/BANDAGES/DRESSINGS) ×6 IMPLANT
DRSG TEGADERM 4X4.75 (GAUZE/BANDAGES/DRESSINGS) IMPLANT
ELECT BLADE 6.5 EXT (BLADE) ×2 IMPLANT
ELECT CAUTERY BLADE 6.4 (BLADE) ×4 IMPLANT
ELECT REM PT RETURN 9FT ADLT (ELECTROSURGICAL) ×3
ELECTRODE REM PT RTRN 9FT ADLT (ELECTROSURGICAL) ×2 IMPLANT
EVACUATOR SILICONE 100CC (DRAIN) ×8 IMPLANT
EVACUATOR SMOKE ACCUVAC VALLEY (FILTER) ×2
GAUZE SPONGE 4X4 12PLY STRL (GAUZE/BANDAGES/DRESSINGS) ×4 IMPLANT
GAUZE SPONGE 4X4 16PLY XRAY LF (GAUZE/BANDAGES/DRESSINGS) ×1 IMPLANT
GAUZE XEROFORM 5X9 LF (GAUZE/BANDAGES/DRESSINGS) IMPLANT
GLOVE BIO SURGEON STRL SZ7 (GLOVE) ×2 IMPLANT
GLOVE BIO SURGEON STRL SZ7.5 (GLOVE) ×6 IMPLANT
GLOVE BIOGEL PI IND STRL 7.0 (GLOVE) IMPLANT
GLOVE BIOGEL PI IND STRL 8 (GLOVE) ×3 IMPLANT
GLOVE BIOGEL PI INDICATOR 7.0 (GLOVE) ×12
GLOVE BIOGEL PI INDICATOR 8 (GLOVE) ×14
GLOVE ECLIPSE 8.0 STRL XLNG CF (GLOVE) ×7 IMPLANT
GLOVE SURG SS PI 6.0 STRL IVOR (GLOVE) ×4 IMPLANT
GLOVE SURG SS PI 7.0 STRL IVOR (GLOVE) ×10 IMPLANT
GOWN STRL REUS W/ TWL LRG LVL3 (GOWN DISPOSABLE) ×4 IMPLANT
GOWN STRL REUS W/ TWL XL LVL3 (GOWN DISPOSABLE) ×2 IMPLANT
GOWN STRL REUS W/TWL LRG LVL3 (GOWN DISPOSABLE) ×6
GOWN STRL REUS W/TWL XL LVL3 (GOWN DISPOSABLE) ×9
KIT BASIN OR (CUSTOM PROCEDURE TRAY) ×6 IMPLANT
KIT ROOM TURNOVER OR (KITS) ×4 IMPLANT
MARKER SKIN DUAL TIP RULER LAB (MISCELLANEOUS) ×4 IMPLANT
NDL HYPO 25GX1X1/2 BEV (NEEDLE) IMPLANT
NEEDLE HYPO 25GX1X1/2 BEV (NEEDLE) IMPLANT
NS IRRIG 1000ML POUR BTL (IV SOLUTION) ×7 IMPLANT
PACK GENERAL/GYN (CUSTOM PROCEDURE TRAY) ×3 IMPLANT
PACK SURGICAL SETUP 50X90 (CUSTOM PROCEDURE TRAY) ×3 IMPLANT
PAD ABD 8X10 STRL (GAUZE/BANDAGES/DRESSINGS) ×2 IMPLANT
PAD ARMBOARD 7.5X6 YLW CONV (MISCELLANEOUS) ×12 IMPLANT
PENCIL BUTTON HOLSTER BLD 10FT (ELECTRODE) ×6 IMPLANT
PREFILTER EVAC NS 1 1/3-3/8IN (MISCELLANEOUS) ×3 IMPLANT
SPONGE LAP 18X18 X RAY DECT (DISPOSABLE) ×8 IMPLANT
STAPLER VISISTAT 35W (STAPLE) ×7 IMPLANT
STRIP CLOSURE SKIN 1/2X4 (GAUZE/BANDAGES/DRESSINGS) ×1 IMPLANT
SUT MNCRL AB 3-0 PS2 18 (SUTURE) ×8 IMPLANT
SUT MNCRL AB 4-0 PS2 18 (SUTURE) ×2 IMPLANT
SUT MON AB 2-0 CT1 36 (SUTURE) ×2 IMPLANT
SUT MON AB 4-0 PC3 18 (SUTURE) ×1 IMPLANT
SUT PDS AB 0 CT 36 (SUTURE) ×6 IMPLANT
SUT PROLENE 3 0 PS 1 (SUTURE) ×8 IMPLANT
SUT VIC AB 3-0 FS2 27 (SUTURE) IMPLANT
SUT VIC AB 3-0 SH 18 (SUTURE) ×1 IMPLANT
SUT VIC AB 3-0 SH 8-18 (SUTURE) ×5 IMPLANT
SYR 50ML SLIP (SYRINGE) IMPLANT
SYR BULB IRRIGATION 50ML (SYRINGE) ×6 IMPLANT
SYR CONTROL 10ML LL (SYRINGE) IMPLANT
TOWEL OR 17X24 6PK STRL BLUE (TOWEL DISPOSABLE) ×6 IMPLANT
TOWEL OR 17X26 10 PK STRL BLUE (TOWEL DISPOSABLE) ×6 IMPLANT
TRAY FOLEY CATH 14FRSI W/METER (CATHETERS) ×1 IMPLANT
TUBE CONNECTING 12'X1/4 (SUCTIONS) ×3
TUBE CONNECTING 12X1/4 (SUCTIONS) ×5 IMPLANT
WATER STERILE IRR 1000ML POUR (IV SOLUTION) ×1 IMPLANT

## 2014-07-04 NOTE — Progress Notes (Signed)
I have seen and examined Stacey Hinton.  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  She presents today for excision and reconstruction.

## 2014-07-04 NOTE — Brief Op Note (Signed)
07/04/2014  1:27 PM  PATIENT:  Stacey Hinton  48 y.o. female  PRE-OPERATIVE DIAGNOSIS:  BREAST CANCER,PHYLLODES TUMOR OF RIGHT BREAST  POST-OPERATIVE DIAGNOSIS:  BREAST CANCER,PHYLLODES TUMOR OF RIGHT BREAST  PROCEDURE:  Procedure(s):  LATISSIMUS FLAP FOR RIGHT BREAST RECONSTRUCTION  (Right) RIGHT MASTECTOMY PARTIAL (Right) Rearrangement of tissue right chest SURGEON:  Surgeon(s) and Role: Panel 1:    * Jackolyn Confer, MD    * Crissie Reese, MD - Primary    * Crissie Reese, MD - Primary  Panel 2:    * Jackolyn Confer, MD - Primary  PHYSICIAN ASSISTANT:   ASSISTANTS: none   ANESTHESIA:   general  EBL:  Total I/O In: 3000 [I.V.:3000] Out: 575 [Urine:475; Blood:100]  BLOOD ADMINISTERED:none  DRAINS: (3) Jackson-Pratt drain(s) with closed bulb suction in the right back donor site (2) and right chest (1)   LOCAL MEDICATIONS USED:  NONE  SPECIMEN:  No Specimen  DISPOSITION OF SPECIMEN:  N/A  COUNTS:  YES  TOURNIQUET:  * No tourniquets in log *  DICTATION: .Other Dictation: Dictation Number [id number]  PLAN OF CARE: Admit to inpatient   PATIENT DISPOSITION:  PACU - hemodynamically stable.   Delay start of Pharmacological VTE agent (>24hrs) due to surgical blood loss or risk of bleeding: no

## 2014-07-04 NOTE — Anesthesia Postprocedure Evaluation (Signed)
  Anesthesia Post-op Note  Patient: Stacey Hinton  Procedure(s) Performed: Procedure(s):  LATISSIMUS FLAP FOR RIGHT BREAST RECONSTRUCTION  (Right) RIGHT MASTECTOMY PARTIAL (Right)  Patient Location: PACU  Anesthesia Type:General  Level of Consciousness: awake, alert  and oriented  Airway and Oxygen Therapy: Patient Spontanous Breathing and Patient connected to nasal cannula oxygen  Post-op Pain: mild  Post-op Assessment: Post-op Vital signs reviewed, Patient's Cardiovascular Status Stable, Respiratory Function Stable, Patent Airway and No signs of Nausea or vomiting  Post-op Vital Signs: Reviewed and stable  Last Vitals:  Filed Vitals:   07/04/14 1315  BP: 120/57  Pulse:   Temp: 36.6 C  Resp:     Complications: No apparent anesthesia complications

## 2014-07-04 NOTE — Transfer of Care (Signed)
Immediate Anesthesia Transfer of Care Note  Patient: Stacey Hinton  Procedure(s) Performed: Procedure(s):  LATISSIMUS FLAP FOR RIGHT BREAST RECONSTRUCTION  (Right) RIGHT MASTECTOMY PARTIAL (Right)  Patient Location: PACU  Anesthesia Type:General  Level of Consciousness: sedated, patient cooperative and responds to stimulation  Airway & Oxygen Therapy: Patient Spontanous Breathing and Patient connected to nasal cannula oxygen  Post-op Assessment: Report given to PACU RN and Post -op Vital signs reviewed and stable  Post vital signs: Reviewed and stable  Complications: No apparent anesthesia complications

## 2014-07-04 NOTE — Anesthesia Preprocedure Evaluation (Addendum)
Anesthesia Evaluation  Patient identified by MRN, date of birth, ID band Patient awake    Reviewed: Allergy & Precautions, H&P , NPO status , Patient's Chart, lab work & pertinent test results  Airway Mallampati: II  Neck ROM: full    Dental   Pulmonary neg pulmonary ROS,          Cardiovascular hypertension,     Neuro/Psych    GI/Hepatic   Endo/Other    Renal/GU      Musculoskeletal   Abdominal   Peds  Hematology   Anesthesia Other Findings   Reproductive/Obstetrics                          Anesthesia Physical Anesthesia Plan  ASA: II  Anesthesia Plan: General   Post-op Pain Management:    Induction: Intravenous  Airway Management Planned: Oral ETT  Additional Equipment:   Intra-op Plan:   Post-operative Plan: Extubation in OR  Informed Consent: I have reviewed the patients History and Physical, chart, labs and discussed the procedure including the risks, benefits and alternatives for the proposed anesthesia with the patient or authorized representative who has indicated his/her understanding and acceptance.     Plan Discussed with: CRNA, Anesthesiologist and Surgeon  Anesthesia Plan Comments:         Anesthesia Quick Evaluation

## 2014-07-04 NOTE — Op Note (Signed)
Operative Note  Stacey Hinton female 48 y.o. 07/04/2014  PREOPERATIVE DX:  Large phyllodes tumor of right breast  POSTOPERATIVE DX:  Same  PROCEDURE:   Right partial mastectomy (approximately 60%)         Surgeon: [name]   Assistants: Crissie Reese M.D.  Anesthesia: General endotracheal anesthesia  Indications:   [name] is a 48 year old female who noticed a mass in the right breast. In 2012 a needle biopsy was performed and was consistent with fibroadenoma. The mass was 2.3 cm at that time. The mass has become increasingly larger. It is involving at least 40-50% of the breast. Large core needle biopsy was performed and was consistent with a possible phyllodes tumor versus fibroadenoma. She now presents for the above procedure followed by reconstruction by Dr. [name]. The procedure and risks including possible recurrence were discussed with her.    Procedure Detail:  She was seen in the holding area in the right breast marked with my initials.  She was brought to the operating room placed supine on the operating table a general anesthetic was given.  The right breast and axillary areas were sterilely prepped and draped. A fishmouth type incision was made beginning at the 12:00 position in the circumareolar area and extending this to the 6:00 position. I then extended the incision did include a small ellipse of skin with a tumor was tenting up the skin. The incision was as extended linearly toward the right axilla. Using electrocautery, subcutaneous flaps were raised in all directions. I then separated the subcutaneous tissue from the mass trying to include normal tissue upon the mass. The mass went all the way down to the pectoralis fascia. Using electrocautery I then excised the mass. This involved approximately 60% of the breast tissue. The medial aspect of the mass was marked with 2 sutures, the anterior aspect was marked with a single suture. The mass weighed a little over  500 g.  The wound was then irrigated and bleeding was controlled electrocautery. Once hemostasis was adequate, an antibiotic soaked sponge was placed in the wound. The skin was closed with staples. Dr. [name] then proceeded with the reconstructive procedure. There were no apparent complications from the first part of the operation.  Estimated Blood Loss:  150 ml  Blood Given: none          Specimens: right breast mass        Complications:  * No complications entered in OR log *

## 2014-07-04 NOTE — H&P (Signed)
I have re-examined and re-evaluated the patient and their are no changes.  See office notes in paper chart for H&P.  Planned procedure: Excision right breast mass (Dr. Zella Richer) Reconstruction right breast with bat-wing or latissimus flap

## 2014-07-05 MED ORDER — HYDROMORPHONE HCL 2 MG PO TABS
2.0000 mg | ORAL_TABLET | ORAL | Status: DC | PRN
  Administered 2014-07-05: 4 mg via ORAL
  Administered 2014-07-05 (×2): 2 mg via ORAL
  Administered 2014-07-05 – 2014-07-06 (×4): 4 mg via ORAL
  Filled 2014-07-05 (×3): qty 2
  Filled 2014-07-05 (×2): qty 1
  Filled 2014-07-05 (×2): qty 2

## 2014-07-05 NOTE — Progress Notes (Signed)
UR completed.  Gracemarie Skeet, RN BSN [facility] CCM Trauma/Neuro ICU Case Manager [phone]  

## 2014-07-05 NOTE — Op Note (Signed)
NAMEMARTENA, Stacey Hinton NO.:  [id number]  MEDICAL RECORD NO.:  [id number]  LOCATION:  6N30C                        FACILITY:  [facility]  PHYSICIAN:  Crissie Reese, M.D.     DATE OF BIRTH:  26-Apr-1966  DATE OF PROCEDURE:  07/04/2014 DATE OF DISCHARGE:                              OPERATIVE REPORT   PREOPERATIVE DIAGNOSES: 1. Large right breast mass. 2. Phyllodes tumor of the right breast.  POSTOPERATIVE DIAGNOSES: 1. Large right breast mass. 2. Phyllodes tumor of the right breast.  PROCEDURES PERFORMED: 1. Right latissimus myocutaneous flap. 2. Distinct procedure adjacent tissue transfer of trunk for     repositioning of the right nipple-areolar complex.  SURGEON:  Crissie Reese, M.D.  ANESTHESIA:  General.  ESTIMATED BLOOD LOSS:  100 mL.  DRAINS:  One 19-French for the right anterior chest and two 19-French for the right back donor site.  CLINICAL NOTE:  A 48 year old woman has a very large tumor of her right breast upper outer quadrant that extends subareolar and into the upper inner quadrant and into the lower outer quadrant.  This has been evaluated and it is a tumor of undetermined behavior locally aggressive and it is felt that it does have elements of phyllodes.  It was clearly medically indicated to remove this large tumor.  The plan was a partial mastectomy by General Surgery which would leave the defect due to a tissue resection that would be on the order of 400-500 g of tissue. This would require reconstruction.  The plan was to spare the nipple- areolar complex if possible.  Various options were discussed with her for reconstruction.  One of these right latissimus flap and then also the possibility of a hemibatwing type of oncologic procedure.  It was felt, however, that latissimus tissue to be removed that it would necessitate the use of a flap for reconstruction.  The latissimus flap was discussed as well as TRAM flap for the reconstruction.   Having discussed both of these options, she preferred latissimus as opposed to the TRAM.  She understood there would be incisions around the nipple because some sort of flap involving the nipple to reposition would be necessary due to the paddle type of excision was planned by General Surgery.  The procedure was discussed as well as location of scar and photographs were shown and the risks were discussed include not limited to, bleeding, infection, healing problems, scarring, loss of sensation, fluid accumulations, anesthesia-related complications, loss of sensation in the nipple, contour deformities, loss of strength and/or range of motion in the right arm and shoulder, loss of tissue, loss of the flap, loss of the portions of the flap, and chronic pain, DVT, PE, asymmetry, and overall disappointment.  She understood all of this as well as possibility of secondary procedures and wished to proceed.  DESCRIPTION OF PROCEDURE:  The patient was marked in the holding area for the latissimus flap.  She was taken to the operating room and placed supine.  After successful induction of general anesthesia, Dr. Zella Richer, the general surgeon, performed the partial mastectomy.  I did assist with his procedure at his request.  Having concluded that, a lap sponge soaked with antibiotic  solution was placed in the wound.  The wound was stapled, closed, and then covered with impervious large Steri- Drape.  The patient was then turned into a left lateral decubitus position with an axillary roll and stabilized with a beanbag.  She was then prepped with ChloraPrep for her back and then Betadine for the right chest area having removed the large Steri-Drape in order to prep this wound in the field.  She was then draped with sterile drapes including impervious utility drapes.  The skin paddle was then incised in beveling superiorly and inferiorly in order to include as much adipose tissue as possible and a  much broader attachment of the flap at the level of the muscle then at the skin in order to preserve blood supply and volume.  The dissection was carried out down to the muscle and then to the medial, lateral, superior, and inferior borders of the muscle.  The subcutaneous tunnel was then created to the right chest. The muscle was then divided distally and then reflected in a cephalad direction dissecting from inferior to superior.  Meticulous hemostasis maintained using electrocautery and larger vessels were double and triple ligated with Ligaclips and divided and 1 larger perforating vessel was suture ligated with a 3-0 Vicryl suture and divided.  The flap had been mobilized.  Great care taken to avoid any damage to the thoracodorsal pedicle and long thoracic.  The flap was then passed to the right chest with a lap sponge having been removed.  That wound was again stapled, closed over the flap and the flap had excellent color and bright red bleeding in its periphery consistent with viability as it was being transferred.  The back was then irrigated thoroughly with saline and meticulous hemostasis with electrocautery.  Two 19-French drains positioned, brought through separate stab wounds inferoanteriorly and secured with 3-0 Prolene sutures.  Excellent hemostasis having been confirmed, the closure of the back with 0 PDS interrupted inverted deep sutures, 2-0 Monocryl interrupted inverted deep dermal sutures, and 3-0 Monocryl running subcuticular suture with a couple of reinforcing, 3-0 Prolene simple sutures for the skin at the central aspect where there would be maximum tension on the wound closure.  The Dermabond was applied.  Dry sterile dressing and a Biopatch with dry sterile impervious dressings for the drains.  She was then covered over her wound on the right chest with again a large Steri-Drape impervious and was then placed supine.  Once she had been positioned, the large  Steri-Drape was removed.  The chest was prepped with Betadine and draped with sterile drapes including impervious utility drapes.  The skin staples were removed.  The flap was inspected.  It was noted to have excellent color and bright red bleeding in its periphery consistent with viability.  The flap was de- epithelialized and excellent bleeding.  Irrigation and meticulous hemostasis with electrocautery.  A 19-French drain was positioned, brought through separate stab wound inferiorly and secured with a 3-0 Prolene suture.  The excellent hemostasis having been confirmed.  The insetting with 3-0 Vicryl simple interrupted sutures to inset the flap superiorly, medially, and inferiorly.  The flap was then buried in position.  A portion of the tissue superior nipple-areolar complex was then excised.  Nipple-areolar complex advanced as a flap to preserve its contour.  The hemostasis with electrocautery and the closure 3-0 Monocryl interrupted inverted deep dermal sutures and running 4-0 Monocryl subcuticular suture with insetting of the nipple-areolar complex in its position with 3-0 Monocryl interrupted  inverted deep dermal sutures and 4-0 Monocryl running subcuticular suture.  Dermabond was applied.  Biopatch dry sterile and the impervious dressing to the drain and she was placed in the breast vest and transferred to the recovery room in stable having tolerated the procedure well.     Crissie Reese, M.D.     DB/MEDQ  D:  07/04/2014  T:  07/05/2014  Job:  [id number]

## 2014-07-05 NOTE — Progress Notes (Signed)
Feeling okay.  We discussed my part of the surgery. Reconstruction looks good.  Pathology pending.

## 2014-07-05 NOTE — Progress Notes (Signed)
Subjective: Good pain control. Slight nausea last night has resolved.Taking fluids well.  Objective: Vital signs in last 24 hours: Temp:  [97.8 F (36.6 C)-98.5 F (36.9 C)] 98.2 F (36.8 C) (10/06 0525) Pulse Rate:  [72-87] 74 (10/06 0525) Resp:  [12-21] 16 (10/06 0525) BP: (105-170)/(57-85) 135/76 mmHg (10/06 0525) SpO2:  [96 %-100 %] 100 % (10/06 0525)  Intake/Output from previous day: 10/05 0701 - 10/06 0700 In: 5040 [P.O.:240; I.V.:4650; IV Piggyback:150] Out: 3010 [Urine:2675; Drains:235; Blood:100] Intake/Output this shift:    Operative sites: Mastectomy flaps viable. Right breast is soft. Flap is buried. Drains functioning. Drainage thin. No evidence of bleeding.  No results found for this basename: WBC, HGB, HCT, PLATELETS, NA, K, CL, CO2, BUN, CREATININE, GLU,  in the last 72 hours  Studies/Results: No results found.  Assessment/Plan: Ambulate. D/C foley. D/C PCA and begin po pain med.   LOS: 1 day    Stacey Hinton M 07/05/2014 7:04 AM

## 2014-07-06 MED ORDER — HYDROMORPHONE HCL 2 MG PO TABS: 2.0000 mg | ORAL_TABLET | ORAL | Status: DC | PRN

## 2014-07-06 MED ORDER — BACITRACIN-NEOMYCIN-POLYMYXIN 400-5-5000 EX OINT
TOPICAL_OINTMENT | CUTANEOUS | Status: AC
  Filled 2014-07-06: qty 1

## 2014-07-06 MED ORDER — METHOCARBAMOL 500 MG PO TABS: 500.0000 mg | ORAL_TABLET | Freq: Four times a day (QID) | ORAL | Status: DC

## 2014-07-06 MED ORDER — DSS 100 MG PO CAPS: 100.0000 mg | ORAL_CAPSULE | Freq: Every day | ORAL | Status: DC

## 2014-07-06 NOTE — Discharge Summary (Signed)
Physician Discharge Summary  Patient ID: Stacey Hinton MRN: [id number] DOB/AGE: Mar 28, 1966 48 y.o.  Admit date: 07/04/2014 Discharge date: 07/06/2014  Admission Diagnoses:Right breast mass. Phylloides tumor right breast  Discharge Diagnoses: Same Active Problems:   Breast mass   Discharged Condition: good  Hospital Course: On the day of admission the patient was taken to surgery and had right  Partial mastectomy and reconstruction with latissimus flap and nipple repositioning. The patient tolerated the procedures well. Postoperatively, the breast remained soft consistent with flap viability (it is a buried flap). The patient was ambulatory and tolerating diet on the first postoperative day. DVT prophylaxis and surgical antibiotic prophylaxis was continued while hospitalized.  Treatments: antibiotics: Ancef, anticoagulation: heparin and surgery: right partial mastectomy and reconstruction with latissimus flap.  Discharge Exam: Blood pressure 146/76, pulse 92, temperature 98 F (36.7 C), temperature source Oral, resp. rate 17, height 5' 7.5" (1.715 m), weight 158 lb (71.668 kg), last menstrual period 06/28/2014, SpO2 96.00%.  Operative sites: Nipple complex is viable. Right breast is soft. Drains functioning. Drainage thin. There is no evidence of bleeding or infection at either site.  Disposition: Home     Medication List         amLODipine 5 MG tablet  Commonly known as:  NORVASC  Take 5 mg by mouth daily.     DSS 100 MG Caps  Take 100 mg by mouth daily.     HYDROmorphone 2 MG tablet  Commonly known as:  DILAUDID  Take 1-2 tablets (2-4 mg total) by mouth every 4 (four) hours as needed for moderate pain.     losartan 50 MG tablet  Commonly known as:  COZAAR  Take 50 mg by mouth daily.     methocarbamol 500 MG tablet  Commonly known as:  ROBAXIN  Take 1 tablet (500 mg total) by mouth 4 (four) times daily.         SignedHarlow Mares, [name] 07/06/2014, 7:30 AM

## 2014-07-06 NOTE — Progress Notes (Signed)
Looks better today.  Pathology not back.  Will see her in office in 2-3 weeks.

## 2014-07-06 NOTE — Discharge Instructions (Addendum)
No lifting for 6 weeks No vigorous activity for 6 weeks (including outdoor walks) No driving for 4 weeks OK to walk up stairs slowly Stay propped up Use incentive spirometer at home every hour while awake No shower while drains are in place Empty drains at least three times a day and record the amounts separately Change drain dressings every third day if instructed to do so by Dr. [name] (no need to begin this yet)  Apply Bacitracin antibiotic ointment to the drain sites  Place gauze dressing over drains  Secure the gauze with tape Switch to sports bra for breast support Take an over-the-counter stool softener (such as Colace) while on pain medication See Dr. [name] in office on Monday For questions call [phone] or [phone]   Appointment with Dr. Zella Richer in 3 weeks.  Please call to make this appointment, [phone].

## 2014-07-06 NOTE — Progress Notes (Signed)
AVS discharge instructions were given and went over with patient and her husband. Patient and her husband were shown how to empty JP drains, how to measure it and record output. Also given written instructions on JP drain. Patient stated that she did not have any questions. Volunteers will assist to transportation when patient is ready.

## 2014-07-07 ENCOUNTER — Encounter (HOSPITAL_COMMUNITY): Payer: Self-pay | Admitting: Plastic Surgery

## 2014-07-13 ENCOUNTER — Encounter (INDEPENDENT_AMBULATORY_CARE_PROVIDER_SITE_OTHER): Payer: Self-pay | Admitting: General Surgery

## 2014-07-13 ENCOUNTER — Other Ambulatory Visit (INDEPENDENT_AMBULATORY_CARE_PROVIDER_SITE_OTHER): Payer: Self-pay | Admitting: *Deleted

## 2014-07-13 DIAGNOSIS — C50912 Malignant neoplasm of unspecified site of left female breast: Secondary | ICD-10-CM

## 2014-07-13 DIAGNOSIS — C50919 Malignant neoplasm of unspecified site of unspecified female breast: Secondary | ICD-10-CM

## 2014-07-13 NOTE — Progress Notes (Signed)
Patient ID: Stacey Hinton, female   DOB: March 24, 1966, 48 y.o.   MRN: [id number] Spoke with her about the discussion at the breast cancer conference this morning. No further surgery was recommended.  Will refer her to medical oncology and radiation oncology. We'll order a CT scan of the chest.

## 2014-07-14 ENCOUNTER — Encounter: Payer: Self-pay | Admitting: *Deleted

## 2014-07-14 NOTE — Progress Notes (Signed)
Location of Breast Cancer: right breast , 10:00 o'clock- phyllodes tumor, malignant  Histology per Pathology Report:  07/04/14 Diagnosis Breast, partial mastectomy, Right - MALIGNANT (HIGH GRADE) PHYLLODES TUMOR. SEE COMMENT. - TUMOR IS 2 MM FROM THE NEAREST MARGIN (POSTERIOR). Microscopic Comment Numerous representative sections of the 8.3 cm mass were reviewed. Slide sections demonstrate a malignant biphasic epithelial and stromal tumor with expansive stromal overgrowth demonstrating a malignant spindle cell population with significant cytologic atypia, mitotic activity (including atypical forms) and a focally  infiltrative border. No heterologous elements are present. The epithelium within the tumor is largely overgrown and replaced by the stromal proliferation. Where the epithelium is present, it does not demonstrate   significant cytoarchitectural atypia. The tumor is focally 85m from the posterior margin. The case was reviewed with Dr. KLyndon Codewho concurs.   Receptor Status: ER(), PR (), Her2-neu ()  Did patient present with symptoms (if so, please note symptoms) or was this found on screening mammography?: enlarging breast mass followed since 2012  Past/Anticipated interventions by surgeon, if any: 07/04/14 Dr RZella Richer right partial mastectomy, Dr BHarlow Mares right latissimus myocutaneous flap  Past/Anticipated interventions by medical oncology, if any: Chemotherapy , Dr MJana Hakim new consultation on 07/27/14  Lymphedema issues, if any:  no    Pain issues, if any:   Soreness of right breast at insertion site of 2 JP drains, Dilaudid prn  SAFETY ISSUES:  Prior radiation? no  Pacemaker/ICD? no  Possible current pregnancy?  last menses 06/28/14  Is the patient on methotrexate? no  Current Complaints / other details:  Menarche age [id number] birth age 48 C section x 2, P2, spontaneous ab x 1 married, paralegal    [place] MVerneita Griffes R[place]10/15/2015,11:15 AM

## 2014-07-15 ENCOUNTER — Ambulatory Visit
Admission: RE | Admit: 2014-07-15 | Discharge: 2014-07-15 | Disposition: A | Discharge status: Home / Self Care | Payer: BC Managed Care – PPO | Source: Ambulatory Visit | Attending: General Surgery | Admitting: General Surgery

## 2014-07-15 ENCOUNTER — Telehealth: Payer: Self-pay | Admitting: *Deleted

## 2014-07-15 DIAGNOSIS — C50912 Malignant neoplasm of unspecified site of left female breast: Secondary | ICD-10-CM

## 2014-07-15 MED ORDER — IOHEXOL 300 MG/ML  SOLN
75.0000 mL | Freq: Once | INTRAMUSCULAR | Status: AC | PRN
  Administered 2014-07-15: 75 mL via INTRAVENOUS

## 2014-07-15 NOTE — Telephone Encounter (Signed)
Received referral from [facility].  Called pt and confirmed 07/27/14 appt w/ her.  Mailed before appt letter, welcoming packet & intake form to pt.  Emailed Engineer, civil (consulting) at [employer] to make her aware.  Added to spreadsheet.

## 2014-07-18 ENCOUNTER — Encounter: Payer: Self-pay | Admitting: Radiation Oncology

## 2014-07-19 ENCOUNTER — Ambulatory Visit
Admission: RE | Admit: 2014-07-19 | Discharge: 2014-07-19 | Disposition: A | Discharge status: Home / Self Care | Payer: BC Managed Care – PPO | Source: Ambulatory Visit | Attending: Radiation Oncology | Admitting: Radiation Oncology

## 2014-07-19 ENCOUNTER — Encounter: Payer: Self-pay | Admitting: Radiation Oncology

## 2014-07-19 VITALS — BP 137/75 | HR 105 | Temp 98.0°F | Resp 18 | Ht 67.5 in | Wt 156.0 lb

## 2014-07-19 DIAGNOSIS — Z510 Encounter for antineoplastic radiation therapy: Secondary | ICD-10-CM | POA: Diagnosis present

## 2014-07-19 DIAGNOSIS — N63 Unspecified lump in unspecified breast: Secondary | ICD-10-CM

## 2014-07-19 DIAGNOSIS — C50911 Malignant neoplasm of unspecified site of right female breast: Secondary | ICD-10-CM

## 2014-07-19 HISTORY — DX: Malignant neoplasm of unspecified site of unspecified female breast: C50.919

## 2014-07-19 HISTORY — DX: Neoplasm of uncertain behavior of unspecified breast: D48.60

## 2014-07-19 NOTE — Progress Notes (Signed)
[facility] NEW PATIENT EVALUATION  Name: Stacey Hinton MRN: [id number]  Date:   07/19/2014           DOB: 29-Jan-1966  Status: outpatient   CC: Lilian Coma, MD  Jackolyn Confer, MD Dr. Crissie Reese, Dr. 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 female who is seen today through the courtesy Dr. Zella Richer for discussion of the role of radiation therapy in the management of her malignant phyllodes tumor of the right breast. In December of 2012 she underwent a right breast core biopsy which was diagnostic for "fibroadenoma". She was lost to followup and represented to Dr. Zella Richer this past May with a large mass within the upper outer quadrant of the right breast. She states that she first felt a mass approximately 1 year ago. Dr. Zella Richer appreciated a 7 x 7 cm mass. Along with Dr. [name], she underwent excision of the right breast mass (partial mastectomy) with a latissimus myocutaneous flap reconstruction. She is doing well postoperatively. On review of her pathology she was found to have malignant phyllodes tumor which was 2 mm from the posterior/deep margin.  PREVIOUS RADIATION THERAPY: No   PAST MEDICAL HISTORY:  has a past medical history of Hypertension; Phyllodes tumor (07/04/14); and Breast cancer.     PAST SURGICAL HISTORY:  Past Surgical History  Procedure Laterality Date  . Cesarean section  06/16/98, 03/06/06  . Mastectomy, partial Right 07/04/2014    WITH RECONSTRUCTION    DR [name]  . Latissimus flap to breast Right 07/04/2014    Procedure:  LATISSIMUS FLAP FOR RIGHT BREAST RECONSTRUCTION ;  Surgeon: Crissie Reese, MD;  Location: [facility];  Service: Plastics;  Laterality: Right;  . Mastectomy, partial Right 07/04/2014    Procedure: RIGHT MASTECTOMY PARTIAL;  Surgeon: Jackolyn Confer, MD;  Location: [facility];  Service: General;  Laterality: Right;     FAMILY HISTORY: family history includes Cancer (age of onset: 61) in her maternal aunt; Hypertension in her mother. Her mother is alive and well at age 48. Her father is 52, having had a stroke.   SOCIAL HISTORY:  reports that she has never smoked. She has never used smokeless tobacco. She reports that she drinks alcohol. She reports that she does not use illicit drugs. Married, 2 children, she works as a Engineer, production .   ALLERGIES: Review of patient's allergies indicates no known allergies.   MEDICATIONS:  Current Outpatient Prescriptions  Medication Sig Dispense Refill  . amLODipine (NORVASC) 5 MG tablet Take 5 mg by mouth daily.      Marland Kitchen HYDROmorphone (DILAUDID) 2 MG tablet Take 1-2 tablets (2-4 mg total) by mouth every 4 (four) hours as needed for moderate pain.  40 tablet  0  . losartan (COZAAR) 50 MG tablet Take 50 mg by mouth daily.      . methocarbamol (ROBAXIN) 500 MG tablet Take 1 tablet (500 mg total) by mouth 4 (four) times daily.  40 tablet  1   No current facility-administered medications for this encounter.     REVIEW OF SYSTEMS:  Pertinent items are noted in HPI.    PHYSICAL EXAM:  height is 5' 7.5" (1.715 m) and weight is 156 lb (70.761 kg). Her oral temperature is 98 F (36.7 C). Her blood pressure is 137/75 and her pulse is 105. Her  respiration is 18.   Alert and oriented 48 year old African American female appearing her stated age. Head and neck examination: Grossly unremarkable. Chest: There are surgical dressings along the right posterior lateral chest and below the right breast reconstruction with 2 Jackson-Pratt drains. Breasts: His surgical wounds along the right breast are healing well. Cosmesis appears to be excellent. Left breast without masses or lesions. Extremities: Without edema.   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 I performed a literature review, the best reference is from  "Up To Date".  There are also retrospective series reported from both [place] and the [place]. The risk for a local recurrence for patients with malignant phyllodes tumors is related to extent surgery, surgical margins, and tumor size. It is near impossible to predict this patient's risk for a local failure, but I would estimate it to be perhaps as high as 15-20% based on her 2 mm posterior margin. Radiation therapy has been shown to decrease the risk for local recurrence. We discussed 2 options; one would be having serial MRI scans of her right breast/chest wall least every 6 months for the next few years, and the other would be proceeding with radiation therapy to her reconstructed right breast/chest wall. We discussed the potential acute and late toxicities of radiation therapy. Ordinarily, latissimus myocutaneous flaps tolerate radiation therapy quite well. She will visit Dr. Jana Hakim next week. The patient can contact me if she wants to proceed with radiation therapy. I told her that I would wait  a minimum of 2-3 months before proceeding with radiation therapy to allow for complete healing if she wants to proceed with radiation therapy.   PLAN: As discussed above.  I spent 50  minutes face to face with the patient and more than 50% of that time was spent in counseling and/or coordination of care.

## 2014-07-19 NOTE — Addendum Note (Signed)
Encounter addended by: Andria Rhein, RN on: 07/19/2014 11:30 AM<BR>     Documentation filed: Charges VN

## 2014-07-19 NOTE — Progress Notes (Signed)
Please see the Nurse Progress Note in the MD Initial Consult Encounter for this patient. 

## 2014-07-20 ENCOUNTER — Ambulatory Visit: Payer: BC Managed Care – PPO | Admitting: Radiation Oncology

## 2014-07-20 ENCOUNTER — Encounter (INDEPENDENT_AMBULATORY_CARE_PROVIDER_SITE_OTHER): Payer: Self-pay | Admitting: General Surgery

## 2014-07-20 NOTE — Progress Notes (Unsigned)
Patient ID: Stacey Hinton, female   DOB: 10-19-1965, 48 y.o.   MRN: [id number]  Stacey Hinton 07/20/2014 10:09 AM Location: [facility] Patient #: [id number] DOB: 1966-05-21 Married / Language: English / Race: Black or African American Female History of Present Illness [name] MD; 07/20/2014 10:47 AM) Patient words: recheck path.  The patient is a 48 year old female    Note:Procedure: Right partial mastectomy and immediate reconstruction  Date: 07/04/14  Pathology: Malignant phyllodes tumor  History: She is here for her first postoperative visit. She is aware of her pathology. She has seen Dr. Valere Dross from radiation oncology and he has discussed options with her. She is scheduled to see Dr. Jana Hakim as well. She still has both drains in. She is seeing Dr. [name] later today. She is quite sore.  Exam: General- Is in NAD. Right breast-wound is clean and intact, two drains present with thin serous output  Other Problems Briant Cedar, [place]; 07/20/2014 10:10 AM) Breast Cancer  Past Surgical History Briant Cedar, [place]; 07/20/2014 10:10 AM) Breast Biopsy Right. Breast Mass; Local Excision Right. Breast Reconstruction Right. Cesarean Section - Multiple  Diagnostic Studies History Briant Cedar, [place]; 07/20/2014 10:10 AM) Colonoscopy never Mammogram within last year Pap Smear 1-5 years ago  Allergies Briant Cedar, [place]; 07/20/2014 10:10 AM) No Known Drug Allergies 07/20/2014  Medication History Briant Cedar, [place]; 07/20/2014 10:11 AM) HYDROmorphone HCl (2MG  Tablet, Oral) Active. Docusate Sodium (100MG  Capsule, Oral) Active. AmLODIPine Besylate (5MG  Tablet, Oral) Active. Losartan Potassium (50MG  Tablet, Oral) Active. Methocarbamol (500MG  Tablet, Oral) Active.  Social History Briant Cedar, [place]; 07/20/2014 10:10 AM) Alcohol use Occasional alcohol use. Caffeine use Carbonated beverages, Coffee, Tea. No drug  use Tobacco use Never smoker.  Family History Briant Cedar, [place]; 07/20/2014 10:10 AM) Hypertension Father, Mother.  Pregnancy / Birth History Briant Cedar, [place]; 07/20/2014 10:10 AM) Age at menarche 21 years. Contraceptive History Oral contraceptives. Gravida 2 Maternal age 21-35 Para 2 Regular periods     Review of Systems Briant Cedar CMA; 07/20/2014 10:10 AM) General Not Present- Appetite Loss, Chills, Fatigue, Fever, Night Sweats, Weight Gain and Weight Loss. Skin Not Present- Change in Wart/Mole, Dryness, Hives, Jaundice, New Lesions, Non-Healing Wounds, Rash and Ulcer. HEENT Present- Wears glasses/contact lenses. Not Present- Earache, Hearing Loss, Hoarseness, Nose Bleed, Oral Ulcers, Ringing in the Ears, Seasonal Allergies, Sinus Pain, Sore Throat, Visual Disturbances and Yellow Eyes. Respiratory Not Present- Bloody sputum, Chronic Cough, Difficulty Breathing, Snoring and Wheezing. Breast Not Present- Breast Mass, Breast Pain, Nipple Discharge and Skin Changes. Cardiovascular Not Present- Chest Pain, Difficulty Breathing Lying Down, Leg Cramps, Palpitations, Rapid Heart Rate, Shortness of Breath and Swelling of Extremities. Gastrointestinal Not Present- Abdominal Pain, Bloating, Bloody Stool, Change in Bowel Habits, Chronic diarrhea, Constipation, Difficulty Swallowing, Excessive gas, Gets full quickly at meals, Hemorrhoids, Indigestion, Nausea, Rectal Pain and Vomiting. Female Genitourinary Not Present- Frequency, Nocturia, Painful Urination, Pelvic Pain and Urgency. Musculoskeletal Not Present- Back Pain, Joint Pain, Joint Stiffness, Muscle Pain, Muscle Weakness and Swelling of Extremities. Neurological Not Present- Decreased Memory, Fainting, Headaches, Numbness, Seizures, Tingling, Tremor, Trouble walking and Weakness. Psychiatric Not Present- Anxiety, Bipolar, Change in Sleep Pattern, Depression, Fearful and Frequent crying. Endocrine Not Present- Cold  Intolerance, Excessive Hunger, Hair Changes, Heat Intolerance, Hot flashes and New Diabetes. Hematology Not Present- Easy Bruising, Excessive bleeding, Gland problems, HIV and Persistent Infections.  Vitals Briant Cedar CMA; 07/20/2014 10:14 AM) 07/20/2014 10:11 AM Weight: 157.13 lb Height: 67.5in Body Surface Area: 1.84 m Body Mass  Index: 24.25 kg/m Temp.: 98.34F  Pulse: 104 (Regular)  BP: 130/68 (Sitting, Left Arm, Standard)     Assessment & Plan [name] MD; 07/20/2014 10:48 AM)  MALIGNANT PHYLLODES TUMOR OF BREAST, RIGHT (174.9  C50.911) Impression: Assessment: Malignant phyllodes tumor of right breast status post partial mastectomy and reconstruction. Wound and reconstruction look good. All margins are clear, the posterior margin (chest wall) is 2 mm. She has talked with Dr. Valere Dross regarding radiation. She is due to see Dr. Jana Hakim as well.  Plan: Followup with me in one month.

## 2014-07-21 ENCOUNTER — Other Ambulatory Visit: Payer: Self-pay | Admitting: *Deleted

## 2014-07-21 ENCOUNTER — Encounter: Payer: Self-pay | Admitting: *Deleted

## 2014-07-21 DIAGNOSIS — C50919 Malignant neoplasm of unspecified site of unspecified female breast: Secondary | ICD-10-CM | POA: Insufficient documentation

## 2014-07-21 DIAGNOSIS — C50911 Malignant neoplasm of unspecified site of right female breast: Secondary | ICD-10-CM

## 2014-07-21 HISTORY — DX: Malignant neoplasm of unspecified site of unspecified female breast: C50.919

## 2014-07-22 ENCOUNTER — Ambulatory Visit: Payer: BC Managed Care – PPO

## 2014-07-22 ENCOUNTER — Ambulatory Visit: Payer: BC Managed Care – PPO | Admitting: Radiation Oncology

## 2014-07-27 ENCOUNTER — Ambulatory Visit (HOSPITAL_BASED_OUTPATIENT_CLINIC_OR_DEPARTMENT_OTHER): Payer: BC Managed Care – PPO | Admitting: Oncology

## 2014-07-27 ENCOUNTER — Ambulatory Visit: Payer: BC Managed Care – PPO

## 2014-07-27 ENCOUNTER — Encounter: Payer: Self-pay | Admitting: Oncology

## 2014-07-27 VITALS — BP 153/74 | HR 87 | Temp 98.1°F | Resp 18 | Ht 67.0 in | Wt 155.1 lb

## 2014-07-27 DIAGNOSIS — C50911 Malignant neoplasm of unspecified site of right female breast: Secondary | ICD-10-CM

## 2014-07-27 NOTE — Progress Notes (Signed)
[name]  Telephone:([phone]) [phone] Fax:(336) [phone]     ID: Stacey Hinton DOB: 06-18-1966  MR#: [id number]  OAC#:[id number]  Patient Care Team: Stacey Coma, MD as PCP - General (Family Medicine) Stacey Cruel, MD as Consulting Physician (Oncology) OTHER MD: Stacey Hinton, Arloa Koh, Crissie Reese  CHIEF COMPLAINT: Malignant phyllodes tumor  CURRENT TREATMENT: Awaiting adjuvant radiation   HISTORY OF PRESENT ILLNESS: The patient tells me Dr. Madaline Hinton noted a mass in her right breast during routine exam in the fall of 2012. He set her up for mammography and ultrasonography at the breast center, performed 06/14/2011. The palpable mass was in the outer right breast in compression views showed a partially circumscribed oval mass in that area. This was at the 9:00 position 6 cm from the nipple by physical exam. Ultrasound showed a circumscribed lobulated oval mass measuring 2.3 cm.  This was felt to be most likely a fibroadenoma and on 09/09/2011 this was confirmed by biopsy of the right breast mass, which showed ([id number]) a fibroadenoma with no evidence of malignancy.  The patient underwent a digital screening mammography 10/12/2012 and 10/14/2013 which showed no suspicious findings. The patient's breast density was noted to the category D. despite this, when Dr. table and examined the patient in February 2015 it seemed to him that the mass had grown some. The patient was referred to Dr. Lynwood Hinton for further evaluation and he confirmed that the patient's left breast was larger than the right and that there was a 7 cm right breast mass with tenting up of the skin, but without inflammatory changes. Biopsy of this mass was obtained 02/14/2014, and now it showed ([id number]) a biphasic stromal lesion favored to be a phyllodes tumor.  With this information and with the assistance of Dr. Harlow Hinton the patient underwent right partial mastectomy with immediate  latissimus flap and nipple repositioning on 07/04/2014. The pathology from that procedure ([id number]) showed a high-grade phyllodes tumor measuring 8.3 cm. There was expensive stromal overgrowth, significant cytologic atypia, increased mitotic activity and a focally infiltrative border. The tumor was focally 2 mm from the deep margin.   At the multidisciplinary breast cancer conference 07/13/2014 this case was discussed. The deep margin is down to muscle and therefore cannot be improved. Staging studies were suggested to rule out metastatic disease, and it was felt that radiation should be strongly considered. Follow-up with MRI was also discussed, since the patient's mammograms had been essentially noninformative.  The patient's subsequent history is as detailed below  INTERVAL HISTORY: Stacey Hinton was evaluated in the breast clinic 07/27/2014 accompanied by her husband Stacey Hinton.  REVIEW OF SYSTEMS: She did well with surgery, with no unusual bleeding, fever, or pain. She is much less sore now that the drains have been removed. Cosmetically she feels the reconstruction is "an A". She denies unusual headaches, visual changes, nausea, vomiting, stiff neck, cough, phlegm production, pleurisy, shortness of breath, chest pain or pressure, palpitations, or change in bowel or bladder habits. A detailed review of systems today was noncontributory  PAST MEDICAL HISTORY: Past Medical History  Diagnosis Date  . Hypertension   . 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 Breast cancer   . 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 Cesarean section  06/16/98, 03/06/06  . Mastectomy, partial Right 07/04/2014    WITH RECONSTRUCTION    DR Stacey Hinton  .  Latissimus flap to breast Right 07/04/2014    Procedure:  LATISSIMUS FLAP FOR RIGHT BREAST RECONSTRUCTION ;  Surgeon: Crissie Reese, MD;  Location: [facility];  Service: Plastics;   Laterality: Right;  . Mastectomy, partial Right 07/04/2014    Procedure: RIGHT MASTECTOMY PARTIAL;  Surgeon: Stacey Confer, MD;  Location: [facility];  Service: General;  Laterality: Right;    FAMILY HISTORY Family History  Problem Relation Age of Onset  . Cancer Maternal Aunt 60    breast  . Hypertension Mother    the patient's parents are still living. She is an only child. There is no history of cancer in the family to her knowledge  GYNECOLOGIC HISTORY:  Patient's last menstrual period was 06/28/2014. Menarche age 25, first live birth age 41, the patient is Stacey Hinton. She is still menstruating regularly. 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 Her husband Stacey Hinton is in the service department of a local company. Daughter Stacey Hinton, 45, and son  Stacey Hinton, 17, are at home.      ADVANCED DIRECTIVES: Not in place    HEALTH MAINTENANCE: History  Substance Use Topics  . Smoking status: Never Smoker   . Smokeless tobacco: Never Used  . Alcohol Use: Yes     Comment: occ     Colonoscopy: 2   PAP:  Bone density:  Lipid panel:  No Known Allergies  Current Outpatient Prescriptions  Medication Sig Dispense Refill  . amLODipine (NORVASC) 5 MG tablet Take 5 mg by mouth daily.      Marland Kitchen HYDROmorphone (DILAUDID) 2 MG tablet Take 1-2 tablets (2-4 mg total) by mouth every 50 (four) hours as needed for moderate pain.  40 tablet  0  . losartan (COZAAR) 50 MG tablet Take 50 mg by mouth daily.      . methocarbamol (ROBAXIN) 500 MG tablet Take 1 tablet (500 mg total) by mouth 4 (four) times daily.  40 tablet  1   No current facility-administered medications for this visit.    OBJECTIVE: middle-aged African-American woman who appears younger than stated age 48 Vitals:   07/27/14 1641  BP: 153/74  Pulse: 87  Temp: 98.1 F (36.7 C)  Resp: 18     Body mass index is 24.29 kg/(m^2).    ECOG FS:1 - Symptomatic but  completely ambulatory  Ocular: Sclerae unicteric, pupils equal, round and reactive to light Ear-nose-throat: Oropharynx clear, dentition in good repair Lymphatic: No cervical or supraclavicular adenopathy Lungs no rales or rhonchi, good excursion bilaterally Heart regular rate and rhythm, no murmur appreciated Abd soft, nontender, positive bowel sounds MSK no focal spinal tenderness, no upper extremity lymphedema Neuro: non-focal, well-oriented, positive affect Breasts: Right breast is status post recent lumpectomy and reconstruction. There is no evidence of dehiscence, erythema, or swelling. There is no evidence of residual disease by palpation or inspection. The cosmetic result is excellent. The left breast is unremarkable.   LAB RESULTS:  CMP     Component Value Date/Time   NA 138 06/24/2014 1358   K 3.6* 06/24/2014 1358   CL 101 06/24/2014 1358   CO2 26 06/24/2014 1358   GLUCOSE 88 06/24/2014 1358   BUN 6 06/24/2014 1358   CREATININE 0.54 06/24/2014 1358   CALCIUM 8.8 06/24/2014 1358   PROT 7.7 06/24/2014 1358   ALBUMIN 3.9 06/24/2014 1358   AST 16 06/24/2014 1358   ALT 11 06/24/2014 1358   ALKPHOS  82 06/24/2014 1358   BILITOT 0.2* 06/24/2014 1358   GFRNONAA >90 06/24/2014 1358   GFRAA >90 06/24/2014 1358    I No results found for this basename: SPEP,  UPEP,   kappa and lambda light chains    Lab Results  Component Value Date   WBC 5.4 06/24/2014   NEUTROABS 2.5 06/24/2014   HGB 12.4 06/24/2014   HCT 36.7 06/24/2014   MCV 82.8 06/24/2014   PLT 329 06/24/2014      Chemistry      Component Value Date/Time   NA 138 06/24/2014 1358   K 3.6* 06/24/2014 1358   CL 101 06/24/2014 1358   CO2 26 06/24/2014 1358   BUN 6 06/24/2014 1358   CREATININE 0.54 06/24/2014 1358      Component Value Date/Time   CALCIUM 8.8 06/24/2014 1358   ALKPHOS 82 06/24/2014 1358   AST 16 06/24/2014 1358   ALT 11 06/24/2014 1358   BILITOT 0.2* 06/24/2014 1358       No results found for this basename: LABCA2      No components found with this basename: LABCA125    No results found for this basename: INR,  in the last 168 hours  Urinalysis No results found for this basename: colorurine,  appearanceur,  labspec,  phurine,  glucoseu,  hgbur,  bilirubinur,  ketonesur,  proteinur,  urobilinogen,  nitrite,  leukocytesur    STUDIES: Ct Chest W Contrast  07/15/2014   CLINICAL DATA:  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  EXAM: CT CHEST WITH CONTRAST  TECHNIQUE: Multidetector CT imaging of the chest was performed during intravenous contrast administration.  CONTRAST:  56mL OMNIPAQUE IOHEXOL 300 MG/ML  SOLN  COMPARISON:  Chest CT dated 06/24/2014  FINDINGS: The patient has had right breast lumpectomy with breast reconstruction. There are postsurgical changes in the anterior and lateral aspects of the right side of the chest.  There is no adenopathy or mass.  There is no osseous abnormality.  Heart size and pulmonary vascularity are normal. No hilar or mediastinal adenopathy. No effusions. Tiny area of linear atelectasis at the right lung base posteriorly. The lungs are otherwise clear.  The visualized portion of the upper abdomen is normal.  IMPRESSION: 1. No evidence of metastatic disease. 2. Postsurgical changes in and around the right breast as described.   Electronically Signed   By: Rozetta Nunnery M.D.   On: 07/15/2014 15:55    ASSESSMENT: 48 y.o. [place] woman   (1) status post biopsy of a right upper quadrant breast mass 09/09/2011 showing fibroadenoma.  (2) biopsy of an enlarging mass in the right breast upper outer quadrant 02/14/2014 showed a biphasic stromal/epithelial lesion.  (3) status post right lumpectomy 07/04/2014 shows an 8.3 cm malignant phyllodes tumor with extensive stromal overgrowth, significant cytologic atypia, significant mitotic activity, and a focally infiltrative border. The closest margin (posterior) is 2 mm.  PLAN: I spent approximately one hour with the patient and  her husband today going over her situation. She understands while this cancer started in the breast it is not what we generally call a "breast cancer". Those are cancers of the glandular elements of the breast. This is a cancer off connective tissue in the breast and so is better called a sarcoma.  We discussed the fact that sarcomas are relatively rare tumors. Accordingly there is less data to guide [place] and all recommendations have a certain amount of uncertainty attached to them.  Nevertheless we know that these tumors generally  do not spread by way of the lymph nodes and therefore she did not need lymph node sampling. The tentative spread by way of the blood and particularly to the lungs and that is why she had a CT of the chest which fortunately does not show evidence of metastatic disease.  The patient has had risk both for systemic and local recurrence. We have no adjuvant treatment to prevent distant recurrence--we generally do not use chemotherapy in this setting. However I do recommend radiation to prevent a local recurrence. She understands we generally prefer margins of 1 cm or more, and her closest margin was only 2 mm. We cannot improve that margin as it is at the chest wall  Accordingly I quoted her a risk of local recurrence between 15 and 25% with observation alone. She can significantly cut back on that risk by receiving radiation. We discussed the possible toxicities, side effects of radiation and at this point she seems disposed to undergo it. I will make her a return appointment with Dr. Valere Dross in approximately 3 weeks by which time she should've recovered sufficiently from her surgery to consider starting therapy.  I will see her again in 3 months. At that point I will likely start yearly MRI surveillance for the next 5 years. She will have yearly mammography, of course, but she has category D breast density and mammograms are close to useless in this setting, as approved by her recent  history.  The patient has a good understanding of the overall plan. She agrees with it. She knows the goal of treatment in her case is cure. She will call with any problems that may develop before her next visit here.  Stacey Cruel, MD   07/27/2014 7:20 PM

## 2014-07-27 NOTE — Progress Notes (Signed)
Checked in new patient with no financial issues prior to seeing the d and she has [place] insurance. She has not been out of the country

## 2014-07-28 ENCOUNTER — Telehealth: Payer: Self-pay | Admitting: Oncology

## 2014-07-28 ENCOUNTER — Other Ambulatory Visit: Payer: Self-pay | Admitting: Emergency Medicine

## 2014-07-28 NOTE — Telephone Encounter (Signed)
s.w. pt and advised on all appts....Dr. Valere Dross on 11.5 @ 8am

## 2014-08-03 ENCOUNTER — Encounter: Payer: Self-pay | Admitting: *Deleted

## 2014-08-03 NOTE — Progress Notes (Signed)
[facility] Psychosocial Distress Screening Clinical Social Work  Clinical Social Work was referred by distress screening protocol.  The patient scored a 5 on the Psychosocial Distress Thermometer which indicates moderate distress. Clinical Social Worker contacted patient at home to assess for distress and other psychosocial needs.  Patient stated she was feeling "better" and recoverying well from her surgery.  CSw and patietn discussed her treament plan, and patient expressed some questions regarding upcoming appointment.  CSW shared questions and concerns with radiation RN who will follow up.  CSW and patient also discussed common emotional reactions to treatment.  CSW informed patient on the support team and support services at [facility], adn encouraged patient to call with questions or concerns.      Clinical Social Worker follow up needed: No.   ONCBCN DISTRESS SCREENING 07/19/2014  Screening Type Initial Screening  Distress experienced in past week (1-10) 5  Emotional problem type Adjusting to illness  Information Concerns Type Lack of info about diagnosis  Other "lack of information about my diagnosis" listed as most distressing   Johnnye Lana, MSW, LCSW, OSW-C Clinical Social Worker [place] [phone]

## 2014-08-03 NOTE — Progress Notes (Signed)
Location of Breast Cancer: right breast , 10:00 o'clock- phyllodes tumor, malignant  Histology per Pathology Report:  07/04/14 Diagnosis Breast, partial mastectomy, Right - MALIGNANT (HIGH GRADE) PHYLLODES TUMOR. SEE COMMENT. - TUMOR IS 2 MM FROM THE NEAREST MARGIN (POSTERIOR). Microscopic Comment Numerous representative sections of the 8.3 cm mass were reviewed. Slide sections demonstrate a malignant biphasic epithelial and stromal tumor with expansive stromal overgrowth demonstrating a malignant spindle cell population with significant cytologic atypia, mitotic activity (including atypical forms) and a focally infiltrative border. No heterologous elements are present. The epithelium within the tumor is largely overgrown and replaced by the stromal proliferation. Where the epithelium is present, it does not demonstrate significant cytoarchitectural atypia. The tumor is focally 66m from the posterior margin. The case was reviewed with Dr. KLyndon Codewho concurs.   Receptor Status: ER(), PR (), Her2-neu ()  Did patient present with symptoms (if so, please note symptoms) or was this found on screening mammography?: enlarging breast mass followed since 2012  Past/Anticipated interventions by surgeon, if any: 07/04/14 Dr RZella Richer right partial mastectomy, Dr BHarlow Mares right latissimus myocutaneous flap  Past/Anticipated interventions by medical oncology, if any: Chemotherapy , Dr MJana Hakim new consultation on 07/27/14  Lymphedema issues, if any: no   Pain issues, if any: Soreness of right breast at insertion site of 2 JP drains, Dilaudid prn  SAFETY ISSUES:  Prior radiation? no  Pacemaker/ICD? no  Possible current pregnancy? last menses 06/28/14  Is the patient on methotrexate? no  Current Complaints / other details: Menarche age 48 153stlive birth age 48 C section x 2, P2, spontaneous ab x 1 married, paralegal

## 2014-08-04 ENCOUNTER — Ambulatory Visit
Admission: RE | Admit: 2014-08-04 | Discharge: 2014-08-04 | Disposition: A | Discharge status: Home / Self Care | Payer: BC Managed Care – PPO | Source: Ambulatory Visit | Attending: Radiation Oncology | Admitting: Radiation Oncology

## 2014-08-04 ENCOUNTER — Ambulatory Visit: Payer: BC Managed Care – PPO

## 2014-08-15 ENCOUNTER — Encounter (INDEPENDENT_AMBULATORY_CARE_PROVIDER_SITE_OTHER): Payer: Self-pay | Admitting: General Surgery

## 2014-08-15 NOTE — Progress Notes (Signed)
Patient ID: Stacey Hinton, female   DOB: 12/23/1965, 48 y.o.   MRN: [id number]  Stacey Hinton 08/15/2014 1:43 PM Location: [facility] Patient #: [id number] DOB: 10-Sep-1966 Married / Language: English / Race: Black or African American Female History of Present Illness Stacey Hollingshead MD; 08/15/2014 2:01 PM) Patient words: post-op breast.  The patient is a 48 year old female    Note: Right partial mastectomy and immediate reconstruction  Date: 07/04/14  Pathology: Malignant phyllodes tumor  History: She is here for her second postoperative visit. She saw [name] who recommended XRT. She is going to start physical therapy. She is here with her husband.  Allergies (Stacey Hinton, CMA; 08/15/2014 1:43 PM) No Known Drug Allergies 07/20/2014  Medication History (Stacey Hinton, CMA; 08/15/2014 1:43 PM) HYDROmorphone HCl (2MG  Tablet, Oral) Active. Docusate Sodium (100MG  Capsule, Oral) Active. AmLODIPine Besylate (5MG  Tablet, Oral) Active. Losartan Potassium (50MG  Tablet, Oral) Active. Methocarbamol (500MG  Tablet, Oral) Active.    Vitals (Stacey Hinton CMA; 08/15/2014 1:45 PM) 08/15/2014 1:44 PM Weight: 153 lb Height: 67in Body Surface Area: 1.81 m Body Mass Index: 23.96 kg/m Temp.: 64F(Temporal)  Pulse: 79 (Regular)  BP: 134/80 (Sitting, Left Arm, Standard)     Physical Exam Stacey Hollingshead MD; 08/15/2014 2:02 PM)  The physical exam findings are as follows: Note:Right breast incisions is clean and intact, contour looks good.    Assessment & Plan Stacey Hollingshead MD; 08/15/2014 2:02 PM)  MALIGNANT PHYLLODES TUMOR OF BREAST, RIGHT (174.9  C50.911) Impression: Healing well and okay with starting XRT from my standpoint if that is what she and Stacey Hinton decide.  Plan: RTC in 6 months.  Stacey Hinton

## 2014-08-16 ENCOUNTER — Ambulatory Visit: Payer: BC Managed Care – PPO

## 2014-08-16 ENCOUNTER — Ambulatory Visit: Payer: BC Managed Care – PPO | Attending: Plastic Surgery | Admitting: Physical Therapy

## 2014-08-16 ENCOUNTER — Encounter: Payer: Self-pay | Admitting: Physical Therapy

## 2014-08-16 ENCOUNTER — Ambulatory Visit: Payer: BC Managed Care – PPO | Admitting: Radiation Oncology

## 2014-08-16 DIAGNOSIS — Z5189 Encounter for other specified aftercare: Secondary | ICD-10-CM | POA: Insufficient documentation

## 2014-08-16 DIAGNOSIS — C50911 Malignant neoplasm of unspecified site of right female breast: Secondary | ICD-10-CM | POA: Diagnosis not present

## 2014-08-16 DIAGNOSIS — M25611 Stiffness of right shoulder, not elsewhere classified: Secondary | ICD-10-CM

## 2014-08-16 NOTE — Therapy (Signed)
Physical Therapy Evaluation  Patient Details  Name: Stacey Hinton MRN: [id number] Date of Birth: [birth year]  Encounter Date: 08/16/2014      PT End of Session - 08/16/14 1456    Visit Number 1   Number of Visits 9   Date for PT Re-Evaluation 09/15/14   PT Start Time 1300   PT Stop Time 1354   PT Time Calculation (min) 54 min   Activity Tolerance Patient tolerated treatment well      Past Medical History  Diagnosis Date  . Hypertension   . 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 Breast cancer   . 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 Cesarean section  06/16/98, 03/06/06  . Mastectomy, partial Right 07/04/2014    WITH RECONSTRUCTION    DR [name]  . Latissimus flap to breast Right 07/04/2014    Procedure:  LATISSIMUS FLAP FOR RIGHT BREAST RECONSTRUCTION ;  Surgeon: Crissie Reese, MD;  Location: [place];  Service: Plastics;  Laterality: Right;  . Mastectomy, partial Right 07/04/2014    Procedure: RIGHT MASTECTOMY PARTIAL;  Surgeon: Jackolyn Confer, MD;  Location: [place];  Service: General;  Laterality: Right;    There were no vitals taken for this visit.  Visit Diagnosis:  Stiffness of joint, shoulder region, right - Plan: PT plan of care cert/re-cert      Subjective Assessment - 08/16/14 1309    Symptoms Dr. [name] sent me.  He was pleased with my being able to raise my arm up as much as I can, but I think it would be a good idea to see if it would help.   Pertinent History Right lumpectomy 07/04/14 with removal of about 60% of breast tissue removed; latissimus flap reconstruction at that time also.  No implant.  Pt. is currently deciding whether to have radiation.  No lymph nodes removed. Has had a negative CT scan.   Currently in Pain? No/denies          [facility] PT Assessment - 08/16/14 0001    Assessment   Medical Diagnosis right breast cancer with immediate lat flap reconstruction   Onset  Date 07/04/14   Next MD Visit December   Precautions   Precautions Other (comment)   Precaution Comments no massage; otherwise okay to do normal activities   Restrictions   Weight Bearing Restrictions No   Balance Screen   Has the patient fallen in the past 6 months No   Has the patient had a decrease in activity level because of a fear of falling?  No   Is the patient reluctant to leave their home because of a fear of falling?  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   Other Surveys  Select   AROM   Right Shoulder Extension 53 Degrees   Right Shoulder Flexion 123 Degrees   Right Shoulder ABduction 120 Degrees   Right Shoulder Internal Rotation 60 Degrees   Right Shoulder External Rotation 75 Degrees   Left Shoulder Extension 70 Degrees   Left Shoulder Flexion 149 Degrees   Left Shoulder ABduction 180 Degrees   Left  Shoulder Internal Rotation --  [facility]   Left Shoulder External Rotation 82 Degrees   PROM   Right Shoulder Flexion 142 Degrees   Right Shoulder ABduction 120 Degrees   Strength   Overall Strength Deficits   Overall Strength Comments Right shoulder 3-/5, left grossly 5/5            PT Education - 08/16/14 1455    Education provided Yes   Education Details cane exercises for shoulder flexion and abduction; external rotation on door jamb   Person(s) Educated Patient   Methods Explanation;Demonstration;Handout   Comprehension Returned demonstration;Verbalized understanding              Plan - 08/16/14 1459    Clinical Impression Statement Pt. with good but still limited post-op ROM (s/p lumpectomy with lat flap reconstruction on right side) will benefit from  guidance on a home exercise program and possibly hands-on therapy for improving ROM.   Pt will benefit from skilled therapeutic intervention in order to improve on the following deficits Decreased range of motion   Rehab Potential Excellent   PT Frequency 1x / week  May increase to 2x/week if pt. not independent with making gains   PT Duration 4 weeks   PT Treatment/Interventions Patient/family education;Therapeutic exercise;Manual techniques   PT Next Visit Plan Review current HEP and progress with additional ROM exercises; remeasure right shoulder ROM; manual therapy for right shoulder ROM.   PT Home Exercise Plan See instructions, education section.   Consulted and Agree with Plan of Care Patient        Problem List Patient Active Problem List   Diagnosis Date Noted  . 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                              Katina Dung - 08/16/14 0001    Open a tight or new jar Moderate difficulty   Do heavy household chores (wash walls, wash floors) Moderate difficulty   Carry a shopping bag or briefcase Moderate difficulty   Wash your back Moderate difficulty   Use a knife to cut food Mild difficulty   Recreational activities in which you take some force or impact through your arm, shoulder, or hand (golf, hammering, tennis) Moderate difficulty   During the past week, to what extent has your arm, shoulder or hand problem interfered with your normal social activities with family, friends, neighbors, or groups? Slightly   During the past week, to what extent has your arm, shoulder or hand problem limited your work or other regular daily activities Modererately   Arm, shoulder, or hand pain. Mild   Tingling (pins and needles) in your arm, shoulder, or hand None   Difficulty Sleeping No difficulty   DASH Score 34.09 %                      Long Term Clinic Goals - 08/16/14 1504    CC Long Term Goal  #1   Title Right  shoulder active flexion 145 degrees.   Time 4   Period Weeks   Status New   CC Long Term Goal  #2   Title Right shoulder active abduction 160 degrees.   Time 4   Period Weeks   Status New   CC Long Term Goal  #3   Title Independent with HEP and safe self-progression.   Time 4   Period Weeks  Status New          SALISBURY,DONNA, PT 08/16/2014, 3:10 PM

## 2014-08-16 NOTE — Patient Instructions (Signed)
Flexors Stick Stretch I   Stand or sit, dowel in palm of arm to be stretched. Other arm, holding dowel at side and behind body, pushes arm being stretched forward and upward until straight over head. Hold _5__ seconds. Repeat _5-10__ times per session. Do _2-3__ sessions per day.  Copyright  VHI. All rights reserved.  Flexors Stick Stretch I   Stand or sit, dowel in palm of arm to be stretched. Other arm, holding dowel at side and behind body, pushes arm being stretched forward and upward until straight over head. Hold ___ seconds. Repeat ___ times per session. Do ___ sessions per day.  Copyright  VHI. All rights reserved.  Inferior Capsule Stick Stretch I   Stand or sit, dowel in palm of arm to be stretched. Other arm, holding dowel in front of body, pushes outward and upward until arm being stretched is as high to the side as possible. Hold __5_ seconds. Repeat _5-10__ times per session. Do _2-3__ sessions per day.  Copyright  VHI. All rights reserved.  Inferior Capsule Stick Stretch I   Stand or sit, dowel in palm of arm to be stretched. Other arm, holding dowel in front of body, pushes outward and upward until arm being stretched is as high to the side as possible. Hold _5__ seconds. Repeat __5-10_ times per session. Do _2-3__ sessions per day.  Copyright  VHI. All rights reserved.  External Rotator Cuff Stretch, Standing   Stand, palm against door frame and elbow bent at 90. Turn body away from fixed hand allowing shoulder to come forward. Hold __5_ seconds.  Repeat __5-10_ times per session. Do _2-3__ sessions per day.  Copyright  VHI. All rights reserved.  External Rotator Cuff Stretch, Standing   Stand, palm against door frame and elbow bent at 90. Turn body away from fixed hand allowing shoulder to come forward. Hold _5__ seconds.  Repeat _5-10__ times per session. Do _2-3__ sessions per day.  Copyright  VHI. All rights reserved.

## 2014-08-23 ENCOUNTER — Ambulatory Visit: Payer: BC Managed Care – PPO | Admitting: Physical Therapy

## 2014-08-23 DIAGNOSIS — Z5189 Encounter for other specified aftercare: Secondary | ICD-10-CM | POA: Diagnosis not present

## 2014-08-23 DIAGNOSIS — M25611 Stiffness of right shoulder, not elsewhere classified: Secondary | ICD-10-CM

## 2014-08-23 NOTE — Therapy (Signed)
Physical Therapy Treatment  Patient Details  Name: Stacey Hinton MRN: [id number] Date of Birth: [birth year]  Encounter Date: 08/23/2014      PT End of Session - 08/23/14 1617    Visit Number 2   Number of Visits 9   Date for PT Re-Evaluation 09/15/14   PT Start Time 1446   PT Stop Time 1517   PT Time Calculation (min) 31 min   Activity Tolerance Patient tolerated treatment well   Behavior During Therapy [facility] for tasks assessed/performed      Past Medical History  Diagnosis Date  . Hypertension   . 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 Breast cancer   . 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 Cesarean section  06/16/98, 03/06/06  . Mastectomy, partial Right 07/04/2014    WITH RECONSTRUCTION    DR [name]  . Latissimus flap to breast Right 07/04/2014    Procedure:  LATISSIMUS FLAP FOR RIGHT BREAST RECONSTRUCTION ;  Surgeon: Crissie Reese, MD;  Location: [place];  Service: Plastics;  Laterality: Right;  . Mastectomy, partial Right 07/04/2014    Procedure: RIGHT MASTECTOMY PARTIAL;  Surgeon: Jackolyn Confer, MD;  Location: [place];  Service: General;  Laterality: Right;    There were no vitals taken for this visit.  Visit Diagnosis:  Stiffness of joint, shoulder region, right      Subjective Assessment - 08/23/14 1453    Symptoms went back to work yesterday as is easing into full time hours   Currently in Pain? No/denies          [facility] PT Assessment - 08/23/14 1510    AROM   Right Shoulder Flexion 143 Degrees   Right Shoulder ABduction 148 Degrees          [facility] Adult PT Treatment/Exercise - 08/23/14 1613    Shoulder Exercises: Standing   External Rotation Strengthening;10 reps;Theraband;AROM   Theraband Level (Shoulder External Rotation) Level 3 (Green)   Internal Rotation AROM;Strengthening;10 reps;Theraband   Theraband Level (Shoulder Internal Rotation) Level 3 (Green)   Flexion  AROM;Strengthening;10 reps;Theraband   Theraband Level (Shoulder Flexion) Level 3 (Green)   Extension AROM;Strengthening;10 reps;Theraband   Theraband Level (Shoulder Extension) Level 3 (Green)   Other Standing Exercises towel up the wall stretch and ROM 10 repetitions   Shoulder Exercises: Pulleys   Flexion 2 minutes   ABduction 2 minutes   Shoulder Exercises: Stretch   Cross Chest Stretch 10 seconds;1 rep   Wall Stretch - Flexion 3 reps;10 seconds   Other Shoulder Stretches wall stretch back of arm   Shoulder Exercises: ROM/Strengthening   "W" Arms 5 reps   Other ROM/Strengthening Exercises neural glide and flossing with arm stretch and head turns           Plan - 08/23/14 1617    Clinical Impression Statement improved range of motion since last session. pt ready for strenthening and needs continued stretching in end range of range   PT Next Visit Plan Review rockwood and cane exercises.  manual therapy for end range stretch and ROM         Problem List Patient Active Problem List   Diagnosis Date Noted  . 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   Time 4   Period Weeks   Status On-going   CC Long Term Goal  #2   Title Right shoulder active abduction 160 degrees.   Time 4   Period Weeks   Status On-going   CC Long Term Goal  #3   Title Independent with HEP and safe self-progression.   Time 4   Period Weeks   Status On-going         Donato Heinz. Owens Shark, PT   08/23/2014, 4:21 PM

## 2014-08-23 NOTE — Patient Instructions (Signed)
Strengthening: Resisted Internal Rotation   Hold tubing in left hand, elbow at side and forearm out. Rotate forearm in across body. Repeat ____ times per set. Do ____ sets per session. Do ____ sessions per day.  http://orth.exer.us/830   Copyright  VHI. All rights reserved.  Strengthening: Resisted External Rotation   Hold tubing in right hand, elbow at side and forearm across body. Rotate forearm out. Repeat ____ times per set. Do ____ sets per session. Do ____ sessions per day.  http://orth.exer.us/828   Copyright  VHI. All rights reserved.  Strengthening: Resisted Flexion   Hold tubing with left arm at side. Pull forward and up. Move shoulder through pain-free range of motion. Repeat ____ times per set. Do ____ sets per session. Do ____ sessions per day.  http://orth.exer.us/824   Copyright  VHI. All rights reserved.  Strengthening: Resisted Extension   Hold tubing in right hand, arm forward. Pull arm back, elbow straight. Repeat ____ times per set. Do ____ sets per session. Do ____ sessions per day.  http://orth.exer.us/832   Copyright  VHI. All rights reserved.   

## 2014-08-29 ENCOUNTER — Ambulatory Visit: Payer: BC Managed Care – PPO | Admitting: Physical Therapy

## 2014-08-29 DIAGNOSIS — Z5189 Encounter for other specified aftercare: Secondary | ICD-10-CM | POA: Diagnosis not present

## 2014-08-29 DIAGNOSIS — M25611 Stiffness of right shoulder, not elsewhere classified: Secondary | ICD-10-CM

## 2014-08-29 NOTE — Therapy (Signed)
Physical Therapy Treatment  Patient Details  Name: Stacey Hinton MRN: [id number] Date of Birth: 30-Jul-1966  Encounter Date: 08/29/2014      PT End of Session - 08/29/14 1703    Visit Number 3   Number of Visits 9   Date for PT Re-Evaluation 09/15/14   PT Start Time 1829   PT Stop Time 1516   PT Time Calculation (min) 39 min      Past Medical History  Diagnosis Date  . Hypertension   . 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 Breast cancer   . 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 Cesarean section  06/16/98, 03/06/06  . Mastectomy, partial Right 07/04/2014    WITH RECONSTRUCTION    DR [name]  . Latissimus flap to breast Right 07/04/2014    Procedure:  LATISSIMUS FLAP FOR RIGHT BREAST RECONSTRUCTION ;  Surgeon: Crissie Reese, MD;  Location: [place];  Service: Plastics;  Laterality: Right;  . Mastectomy, partial Right 07/04/2014    Procedure: RIGHT MASTECTOMY PARTIAL;  Surgeon: Jackolyn Confer, MD;  Location: [place];  Service: General;  Laterality: Right;    There were no vitals taken for this visit.  Visit Diagnosis:  Stiffness of joint, shoulder region, right      Subjective Assessment - 08/29/14 1439    Symptoms Didn't get to do much with the exercise over the holiday weekend.   Currently in Pain? No/denies            [facility] Adult PT Treatment/Exercise - 08/29/14 0001    Bed Mobility   Bed Mobility Not assessed   Shoulder Exercises: Standing   External Rotation 10 reps   Theraband Level (Shoulder External Rotation) Level 3 (Green)   Internal Rotation 10 reps   Theraband Level (Shoulder Internal Rotation) Level 3 (Green)   Flexion 10 reps   Theraband Level (Shoulder Flexion) Level 3 (Green)   Extension 10 reps   Theraband Level (Shoulder Extension) Level 3 (Green)   Manual Therapy   Manual Therapy Myofascial release;Passive ROM   Myofascial Release Right UE myofascial pulling in  supine and into left sidelying; right scapular mobilization, especially protraction and depression.   Passive ROM Right shoulder PROM with end range stretch to patient tolerance for ER, abduction, and flexion.   Shoulder Exercises: Stretch   Other Shoulder Stretches dowel stretches in standing for flexion and abduction x 5 each                Plan - 08/29/14 1703    Clinical Impression Statement Still with tightness at end range of motion.  Needed review of HEP.   PT Next Visit Plan Reassess with ROM measurements and check all goals; continue manual techniques for end ROM.  Determine whether further therapy is needed.   Consulted and Agree with Plan of Care Patient        Problem List Patient Active Problem List   Diagnosis Date Noted  . 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

## 2014-09-01 ENCOUNTER — Ambulatory Visit: Payer: BC Managed Care – PPO | Attending: Plastic Surgery | Admitting: Physical Therapy

## 2014-09-01 DIAGNOSIS — C50911 Malignant neoplasm of unspecified site of right female breast: Secondary | ICD-10-CM | POA: Insufficient documentation

## 2014-09-01 DIAGNOSIS — M25611 Stiffness of right shoulder, not elsewhere classified: Secondary | ICD-10-CM | POA: Diagnosis not present

## 2014-09-01 DIAGNOSIS — Z5189 Encounter for other specified aftercare: Secondary | ICD-10-CM | POA: Insufficient documentation

## 2014-09-01 NOTE — Therapy (Addendum)
[place], [place], [address] Phone: [phone]   Fax:  [phone]  Physical Therapy Treatment  Patient Details  Name: SHALENE GALLEN MRN: [id number] Date of Birth: 1965/10/20  Encounter Date: 09/01/2014      PT End of Session - 09/01/14 1724    Visit Number 4   Number of Visits 9   Date for PT Re-Evaluation 09/15/14   PT Start Time 1300   PT Stop Time 1345   PT Time Calculation (min) 45 min   Activity Tolerance Patient tolerated treatment well   Behavior During Therapy [facility] for tasks assessed/performed      Past Medical History  Diagnosis Date  . Hypertension   . 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 Breast cancer   . 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 Cesarean section  06/16/98, 03/06/06  . Mastectomy, partial Right 07/04/2014    WITH RECONSTRUCTION    DR [name]  . Latissimus flap to breast Right 07/04/2014    Procedure:  LATISSIMUS FLAP FOR RIGHT BREAST RECONSTRUCTION ;  Surgeon: Crissie Reese, MD;  Location: [place];  Service: Plastics;  Laterality: Right;  . Mastectomy, partial Right 07/04/2014    Procedure: RIGHT MASTECTOMY PARTIAL;  Surgeon: Jackolyn Confer, MD;  Location: [place];  Service: General;  Laterality: Right;    There were no vitals taken for this visit.  Visit Diagnosis:  Stiffness of joint, shoulder region, right      Subjective Assessment - 09/01/14 1716    Symptoms pt reports she is going to see Dr. [name] this week.  She has been doing fine.   Currently in Pain? No/denies          [facility] PT Assessment - 09/01/14 1731    AROM   Right Shoulder Flexion 168 Degrees   Right Shoulder ABduction 165 Degrees          [facility] Adult PT Treatment/Exercise - 09/01/14 1719    Neck Exercises: Seated   Cervical Rotation Both   Lateral Flexion Both   W Back 10 reps  used mirror for visual cues to prevent  shoulder hiking   Shoulder Shrugs 5 reps   Shoulder Rolls Backwards;5 reps   Other Seated Exercise uppper and mid thoracic stretch   Other Seated Exercise seated modified cobra   Lumbar Exercises: Stretches   Lower Trunk Rotation 5 reps   Shoulder Exercises: Supine   Protraction AROM;10 reps   External Rotation AROM   Flexion AROM;Right;10 reps   Other Supine Exercises D1 pattern   Shoulder Exercises: Sidelying   External Rotation AROM;10 reps   Other Sidelying Exercises scapular retraction   Shoulder Exercises: Pulleys   Flexion 2 minutes   ABduction 2 minutes   Shoulder Exercises: Stretch   Cross Chest Stretch 10 seconds   Wall Stretch - Flexion 10 seconds   Shoulder Exercises: ROM/Strengthening   Wall Wash 10   Ball on Wall 5                Plan - 09/01/14 1724    Clinical Impression Statement Progressing with ROM, but needs continued strenthening  for scapular coordiation and to recondition muscles.  She will ask Dr. [name] if ok for her to start strength program with free weights and if she wants instruction she will call us for appointment.  If nto, will discharge   PT Next Visit Plan  Progress with strength program with free wheight is OK with Dr. Dannielle Burn Term Clinic Goals - 09/01/14 1732    CC Long Term Goal  #1   Title Right shoulder active flexion 145 degrees.   Time 4   Period Weeks   Status Achieved   CC Long Term Goal  #2   Title Right shoulder active abduction 160 degrees.   Time 4   Status Achieved   CC Long Term Goal  #3   Title Independent with HEP and safe self-progression.   Time 4   Period Weeks   Status On-going         Problem List Patient Active Problem List   Diagnosis Date Noted  . 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 Owens Shark, PT 09/01/2014, 5:34 PM      PHYSICAL THERAPY DISCHARGE SUMMARY  Visits from Start of Care: 4  Current functional level related to goals / functional  outcomes: As above    Remaining deficits: As above    Education / Equipment: Exercise  Plan: Patient agrees to discharge.  Patient goals were partially met. Patient is being discharged due to not returning since the last visit.  ?????        Maudry Diego, PT 10/26/2015 10:40 AM

## 2014-09-05 ENCOUNTER — Other Ambulatory Visit: Payer: Self-pay

## 2014-09-05 DIAGNOSIS — Z1231 Encounter for screening mammogram for malignant neoplasm of breast: Secondary | ICD-10-CM

## 2014-09-22 NOTE — Progress Notes (Signed)
Location of Breast Cancer: right breast , 10:00 o'clock- phyllodes tumor, malignant  Histology per Pathology Report:  07/04/14 Diagnosis Breast, partial mastectomy, Right - MALIGNANT (HIGH GRADE) PHYLLODES TUMOR. SEE COMMENT. - TUMOR IS 2 MM FROM THE NEAREST MARGIN (POSTERIOR). Microscopic Comment Numerous representative sections of the 8.3 cm mass were reviewed. Slide sections demonstrate a malignant biphasic epithelial and stromal tumor with expansive stromal overgrowth demonstrating a malignant spindle cell population with significant cytologic atypia, mitotic activity (including atypical forms) and a focally infiltrative border. No heterologous elements are present. The epithelium within the tumor is largely overgrown and replaced by the stromal proliferation. Where the epithelium is present, it does not demonstrate significant cytoarchitectural atypia. The tumor is focally 71m from the posterior margin. The case was reviewed with Dr. KLyndon Codewho concurs.   Receptor Status: ER(), PR (), Her2-neu ()  Did patient present with symptoms (if so, please note symptoms) or was this found on screening mammography?: enlarging breast mass followed since 2012  Past/Anticipated interventions by surgeon, if any: 07/04/14 Dr RZella Richer right partial mastectomy, Dr BHarlow Mares right latissimus myocutaneous flap  Past/Anticipated interventions by medical oncology, if any: Chemotherapy , Dr MJana Hakim The patient has had risk both for systemic and local recurrence. We have no adjuvant treatment to prevent distant recurrence--we generally do not use chemotherapy in this setting. However I do recommend radiation to prevent a local recurrence  Lymphedema issues, if any:   Pain issues, if any:   SAFETY ISSUES:  Prior radiation? no  Pacemaker/ICD? no  Possible current pregnancy? last menses 06/28/14  Is the patient on methotrexate? 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

## 2014-09-27 ENCOUNTER — Ambulatory Visit: Payer: BC Managed Care – PPO

## 2014-09-27 ENCOUNTER — Encounter: Payer: Self-pay | Admitting: Radiation Oncology

## 2014-09-27 ENCOUNTER — Ambulatory Visit
Admission: RE | Admit: 2014-09-27 | Discharge: 2014-09-27 | Disposition: A | Discharge status: Home / Self Care | Payer: BLUE CROSS/BLUE SHIELD | Source: Ambulatory Visit | Attending: Radiation Oncology | Admitting: Radiation Oncology

## 2014-09-27 VITALS — BP 140/83 | HR 86 | Temp 97.9°F | Ht 67.0 in | Wt 157.3 lb

## 2014-09-27 DIAGNOSIS — Z510 Encounter for antineoplastic radiation therapy: Secondary | ICD-10-CM | POA: Diagnosis not present

## 2014-09-27 DIAGNOSIS — C50411 Malignant neoplasm of upper-outer quadrant of right female breast: Secondary | ICD-10-CM | POA: Diagnosis not present

## 2014-09-27 DIAGNOSIS — C50911 Malignant neoplasm of unspecified site of right female breast: Secondary | ICD-10-CM

## 2014-09-27 NOTE — Addendum Note (Signed)
Encounter addended by: Deirdre Evener, RN on: 09/27/2014  2:08 PM<BR>     Documentation filed: Charges VN

## 2014-09-27 NOTE — Progress Notes (Signed)
CC: Dr. Jackolyn Confer, Dr. 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  I first saw the patient in consultation on 07/19/2014. In December of 2012 she underwent a right breast core biopsy which was diagnostic for "fibroadenoma". She was lost to followup and represented to Dr. Zella Richer this past May with a large mass within the upper outer quadrant of the right breast. She states that she first felt a mass approximately 1 year ago. Dr. Zella Richer appreciated a 7 x 7 cm mass. Along with Dr. [name], she underwent excision of the right breast mass (partial mastectomy) with a latissimus myocutaneous flap reconstruction. On review of her pathology she was found to have malignant phyllodes tumor which was 2 mm from the posterior/deep margin.  She is now almost 3 months postop from her reconstruction.  Physical examination: Alert and oriented. Filed Vitals:   09/27/14 1018  BP: 140/83  Pulse: 86  Temp: 97.9 F (36.6 C)   Head and neck examination: Grossly unremarkable.  Nodes: Without palpable cervical, supraclavicular, or axillary lymphadenopathy.  Chest: Lungs clear.  Breasts: Reconstructed right breast which is somewhat indurated, laterally as expected.  There is a surgical wound along the upper-outer quadrant of the reconstructed breast at 10:00.  No masses are appreciated.  Left breast without masses or lesions.  Extremity is: Without edema.  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  We again reviewed the literature, and I would estimate a recurrence rate of at least 15-20% based on her tumor size in 2 mm margin.  Radiation therapy has been shown to decrease the risk for local recurrence.  We again discussed the option of  serial MRI follow-up scans or adjuvant radiation therapy.  We discussed the potential acute and late toxicities of radiation therapy.  Latissimus myocutaneous flaps tolerated radiation therapy quite well.  We can proceed with radiation therapy as early as January.  Consent is signed today should she agree to have radiation therapy.  Plan: She wants to decide if and when to begin adjuvant radiation therapy.  She was given my voicemail to contact me for scheduling of her CT simulation.  30 minutes was spent face-to-face with the patient, primarily counseling the patient and coordinating her care.

## 2014-10-03 ENCOUNTER — Encounter: Payer: Self-pay | Admitting: Radiation Oncology

## 2014-10-03 NOTE — Progress Notes (Signed)
CC: Dr. Jackolyn Confer, Dr. Gunnar Bulla Magrinat   Ms. Stacey Hinton called me today, and she wants to move ahead with radiation therapy.  She will return for simulation/treatment planning in the near future.

## 2014-10-04 ENCOUNTER — Other Ambulatory Visit (HOSPITAL_BASED_OUTPATIENT_CLINIC_OR_DEPARTMENT_OTHER): Payer: BLUE CROSS/BLUE SHIELD

## 2014-10-04 DIAGNOSIS — C50911 Malignant neoplasm of unspecified site of right female breast: Secondary | ICD-10-CM

## 2014-10-04 LAB — CBC WITH DIFFERENTIAL/PLATELET
BASO%: 0.4 % (ref 0.0–2.0)
BASOS ABS: 0 10*3/uL (ref 0.0–0.1)
EOS%: 3.7 % (ref 0.0–7.0)
Eosinophils Absolute: 0.2 10*3/uL (ref 0.0–0.5)
HEMATOCRIT: 36 % (ref 34.8–46.6)
HEMOGLOBIN: 11.8 g/dL (ref 11.6–15.9)
LYMPH%: 38.2 % (ref 14.0–49.7)
MCH: 27.1 pg (ref 25.1–34.0)
MCHC: 32.8 g/dL (ref 31.5–36.0)
MCV: 82.6 fL (ref 79.5–101.0)
MONO#: 0.6 10*3/uL (ref 0.1–0.9)
MONO%: 10.5 % (ref 0.0–14.0)
NEUT#: 2.6 10*3/uL (ref 1.5–6.5)
NEUT%: 47.2 % (ref 38.4–76.8)
PLATELETS: 361 10*3/uL (ref 145–400)
RBC: 4.36 10*6/uL (ref 3.70–5.45)
RDW: 12.7 % (ref 11.2–14.5)
WBC: 5.4 10*3/uL (ref 3.9–10.3)
lymph#: 2.1 10*3/uL (ref 0.9–3.3)
nRBC: 0 % (ref 0–0)

## 2014-10-04 LAB — COMPREHENSIVE METABOLIC PANEL (CC13)
ALBUMIN: 3.7 g/dL (ref 3.5–5.0)
ALT: 12 U/L (ref 0–55)
AST: 15 U/L (ref 5–34)
Alkaline Phosphatase: 89 U/L (ref 40–150)
Anion Gap: 8 mEq/L (ref 3–11)
BUN: 10 mg/dL (ref 7.0–26.0)
CO2: 27 mEq/L (ref 22–29)
Calcium: 8.8 mg/dL (ref 8.4–10.4)
Chloride: 103 mEq/L (ref 98–109)
Creatinine: 0.8 mg/dL (ref 0.6–1.1)
EGFR: >90 mL/min/{1.73_m2} (ref >90)
GLUCOSE: 92 mg/dL (ref 70–140)
POTASSIUM: 3.6 meq/L (ref 3.5–5.1)
Sodium: 138 mEq/L (ref 136–145)
Total Bilirubin: 0.25 mg/dL (ref 0.20–1.20)
Total Protein: 7.5 g/dL (ref 6.4–8.3)

## 2014-10-10 ENCOUNTER — Ambulatory Visit
Admission: RE | Admit: 2014-10-10 | Discharge: 2014-10-10 | Disposition: A | Discharge status: Home / Self Care | Payer: BLUE CROSS/BLUE SHIELD | Source: Ambulatory Visit | Attending: Radiation Oncology | Admitting: Radiation Oncology

## 2014-10-10 DIAGNOSIS — C50911 Malignant neoplasm of unspecified site of right female breast: Secondary | ICD-10-CM

## 2014-10-10 DIAGNOSIS — Z510 Encounter for antineoplastic radiation therapy: Secondary | ICD-10-CM | POA: Diagnosis not present

## 2014-10-10 NOTE — Progress Notes (Signed)
Complex simulation/treatment planning note: The patient was taken to the CT simulator.  A Vac lock immobilization device was constructed on a custom breast board.  Her right breast was marked with radiopaque wires.  She was then scanned.  I chose an isocenter.  The CT data set was sent to the planning system where she was set up to medial and lateral right breast tangents.  2 separate multileaf collimators were designed.  I prescribing 5040 cGy in 28 sessions.  No boost.

## 2014-10-11 ENCOUNTER — Telehealth: Payer: Self-pay | Admitting: Oncology

## 2014-10-11 ENCOUNTER — Ambulatory Visit (HOSPITAL_BASED_OUTPATIENT_CLINIC_OR_DEPARTMENT_OTHER): Payer: BLUE CROSS/BLUE SHIELD | Admitting: Oncology

## 2014-10-11 VITALS — BP 155/79 | HR 83 | Temp 97.6°F | Resp 18 | Ht 67.0 in | Wt 158.9 lb

## 2014-10-11 DIAGNOSIS — Z853 Personal history of malignant neoplasm of breast: Secondary | ICD-10-CM

## 2014-10-11 DIAGNOSIS — C50911 Malignant neoplasm of unspecified site of right female breast: Secondary | ICD-10-CM

## 2014-10-11 NOTE — Progress Notes (Signed)
[facility]  [phone] [phone]     ID: SHANICA CASTELLANOS DOB: Dec 08, 1965  MR#: [id number]  YCX#:[id number]  Patient Care Team: Lilian Coma, MD as PCP - General (Family Medicine) Chauncey Cruel, MD as Consulting Physician (Oncology) OTHER MD: Jackolyn Confer, Arloa Koh, Crissie Reese  CHIEF COMPLAINT: Malignant phyllodes tumor  CURRENT TREATMENT: adjuvant radiation   HISTORY OF PRESENT ILLNESS: From the original intake note:  The patient tells me Dr. Madaline Brilliant noted a mass in her right breast during routine exam in the fall of 2012. He set her up for mammography and ultrasonography at the breast center, performed 06/14/2011. The palpable mass was in the outer right breast in compression views showed a partially circumscribed oval mass in that area. This was at the 9:00 position 6 cm from the nipple by physical exam. Ultrasound showed a circumscribed lobulated oval mass measuring 2.3 cm.  This was felt to be most likely a fibroadenoma and on 09/09/2011 this was confirmed by biopsy of the right breast mass, which showed ([id number]) a fibroadenoma with no evidence of malignancy.  The patient underwent a digital screening mammography 10/12/2012 and 10/14/2013 which showed no suspicious findings. The patient's breast density was noted to the category D. despite this, when Dr. table and examined the patient in February 2015 it seemed to him that the mass had grown some. The patient was referred to Dr. Lynwood Dawley for further evaluation and he confirmed that the patient's left breast was larger than the right and that there was a 7 cm right breast mass with tenting up of the skin, but without inflammatory changes. Biopsy of this mass was obtained 02/14/2014, and now it showed ([id number]) a biphasic stromal lesion favored to be a phyllodes tumor.  With this information and with the assistance of Dr. [name] the patient underwent right partial  mastectomy with immediate latissimus flap and nipple repositioning on 07/04/2014. The pathology from that procedure ([id number]) showed a high-grade phyllodes tumor measuring 8.3 cm. There was expensive stromal overgrowth, significant cytologic atypia, increased mitotic activity and a focally infiltrative border. The tumor was focally 2 mm from the deep margin.   At the multidisciplinary breast cancer conference 07/13/2014 this case was discussed. The deep margin is down to muscle and therefore cannot be improved. Staging studies were suggested to rule out metastatic disease, and it was felt that radiation should be strongly considered. Follow-up with MRI was also discussed, since the patient's mammograms had been essentially noninformative.  The patient's subsequent history is as detailed below  INTERVAL HISTORY: Tkeya  returns today for follow-up of her phyllodes tumor accompanied by her husband [name]. Since her last visit here she had her definitive surgery in her case was presented at the multidisciplinary breast cancer conference. The consensus was that adjuvant radiation would reduce the risk of recurrence and this is being operationalized.   REVIEW OF SYSTEMS: She is doing "good" and is back to work. She has no pain, swelling, bleeding, or other problems from her recent surgery. She is very satisfied with the cosmetic result. A detailed review of systems today was otherwise noncontributory  PAST MEDICAL HISTORY: Past Medical History  Diagnosis Date  . Hypertension   . 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 Breast cancer   . 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 Cesarean section  06/16/98, 03/06/06  .  Mastectomy, partial Right 07/04/2014    WITH RECONSTRUCTION    DR [name]  . Latissimus flap to breast Right 07/04/2014    Procedure:  LATISSIMUS FLAP FOR RIGHT BREAST RECONSTRUCTION  ;  Surgeon: Crissie Reese, MD;  Location: [facility];  Service: Plastics;  Laterality: Right;  . Mastectomy, partial Right 07/04/2014    Procedure: RIGHT MASTECTOMY PARTIAL;  Surgeon: Jackolyn Confer, MD;  Location: [facility];  Service: General;  Laterality: Right;    FAMILY HISTORY Family History  Problem Relation Age of Onset  . Cancer Maternal Aunt 58    breast  . Hypertension Mother    the patient's parents are still living. She is an only child. There is no history of cancer in the family to her knowledge  GYNECOLOGIC HISTORY:  No LMP recorded. Menarche age 7, first live birth age 47, the patient is [place] P2. She is still menstruating regularly. 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 Her husband [name] is in the service department of a local company. Daughter [name], 13, and son  [name], 53, are at home.      ADVANCED DIRECTIVES: Not in place    HEALTH MAINTENANCE: History  Substance Use Topics  . Smoking status: Never Smoker   . Smokeless tobacco: Never Used  . Alcohol Use: Yes     Comment: occ     Colonoscopy:   PAP:  Bone density:  Lipid panel:  No Known Allergies  Current Outpatient Prescriptions  Medication Sig Dispense Refill  . amLODipine (NORVASC) 5 MG tablet Take 5 mg by mouth daily.    Marland Kitchen HYDROmorphone (DILAUDID) 2 MG tablet Take 1-2 tablets (2-4 mg total) by mouth every 4 (four) hours as needed for moderate pain. (Patient not taking: Reported on 09/27/2014) 40 tablet 0  . losartan (COZAAR) 50 MG tablet Take 50 mg by mouth daily.    . methocarbamol (ROBAXIN) 500 MG tablet Take 1 tablet (500 mg total) by mouth 4 (four) times daily. 40 tablet 1   No current facility-administered medications for this visit.    OBJECTIVE: middle-aged African-American woman in no acute distress  Filed Vitals:   10/11/14 1539  BP: 155/79  Pulse: 83  Temp: 97.6 F (36.4 C)  Resp: 18     Body  mass index is 24.88 kg/(m^2).    ECOG FS:0 - Asymptomatic  Sclerae unicteric, pupils equal and reactive Oropharynx clear and moist-- no thrush or other lesions No cervical or supraclavicular adenopathy Lungs no rales or rhonchi Heart regular rate and rhythm Abd soft, nontender, positive bowel sounds MSK no focal spinal tenderness, no upper extremity lymphedema Neuro: nonfocal, well oriented, appropriate affect Breasts: The right breast is status post lumpectomy and latissimus flap reconstruction. The incisions are healing nicely. The cosmetic result is excellent. The right axilla is benign per the left breast is unremarkable.   LAB RESULTS:  CMP     Component Value Date/Time   NA 138 10/04/2014 1540   NA 138 06/24/2014 1358   K 3.6 10/04/2014 1540   K 3.6* 06/24/2014 1358   CL 101 06/24/2014 1358   CO2 27 10/04/2014 1540   CO2 26 06/24/2014 1358   GLUCOSE 92 10/04/2014 1540   GLUCOSE 88 06/24/2014 1358   BUN 10.0 10/04/2014 1540   BUN 6 06/24/2014 1358   CREATININE 0.8 10/04/2014 1540   CREATININE 0.54 06/24/2014 1358   CALCIUM 8.8 10/04/2014 1540  CALCIUM 8.8 06/24/2014 1358   PROT 7.5 10/04/2014 1540   PROT 7.7 06/24/2014 1358   ALBUMIN 3.7 10/04/2014 1540   ALBUMIN 3.9 06/24/2014 1358   AST 15 10/04/2014 1540   AST 16 06/24/2014 1358   ALT 12 10/04/2014 1540   ALT 11 06/24/2014 1358   ALKPHOS 89 10/04/2014 1540   ALKPHOS 82 06/24/2014 1358   BILITOT 0.25 10/04/2014 1540   BILITOT 0.2* 06/24/2014 1358   GFRNONAA >90 06/24/2014 1358   GFRAA >90 06/24/2014 1358    I No results found for: SPEP  Lab Results  Component Value Date   WBC 5.4 10/04/2014   NEUTROABS 2.6 10/04/2014   HGB 11.8 10/04/2014   HCT 36.0 10/04/2014   MCV 82.6 10/04/2014   PLT 361 10/04/2014      Chemistry      Component Value Date/Time   NA 138 10/04/2014 1540   NA 138 06/24/2014 1358   K 3.6 10/04/2014 1540   K 3.6* 06/24/2014 1358   CL 101 06/24/2014 1358   CO2 27 10/04/2014  1540   CO2 26 06/24/2014 1358   BUN 10.0 10/04/2014 1540   BUN 6 06/24/2014 1358   CREATININE 0.8 10/04/2014 1540   CREATININE 0.54 06/24/2014 1358      Component Value Date/Time   CALCIUM 8.8 10/04/2014 1540   CALCIUM 8.8 06/24/2014 1358   ALKPHOS 89 10/04/2014 1540   ALKPHOS 82 06/24/2014 1358   AST 15 10/04/2014 1540   AST 16 06/24/2014 1358   ALT 12 10/04/2014 1540   ALT 11 06/24/2014 1358   BILITOT 0.25 10/04/2014 1540   BILITOT 0.2* 06/24/2014 1358       No results found for: LABCA2  No components found for: LABCA125  No results for input(s): INR in the last 168 hours.  Urinalysis No results found for: COLORURINE  STUDIES: No results found.  ASSESSMENT: 49 y.o. [place] woman   (1) status post biopsy of a right upper quadrant breast mass 09/09/2011 showing fibroadenoma.  (2) biopsy of an enlarging mass in the right breast upper outer quadrant 02/14/2014 showed a biphasic stromal/epithelial lesion.  (3) status post right lumpectomy 07/04/2014 shows an 8.3 cm malignant phyllodes tumor with extensive stromal overgrowth, significant cytologic atypia, significant mitotic activity, and a focally infiltrative border. The closest margin (posterior) is 2 mm.  (a) she had an immediate latissimus flap reconstruction  (4) Adjuvant radiation to be completed 11/24/2013  (5) category D breast density   PLAN: I am concerned that we need to follow Ms. Heitzenrater very closely. She is already scheduled for mammography next week, and that of course is necessary. However I do not think it is sensitive enough to tell us if she is going to be developing cancer in either of her breasts. For that reason I have written for an MRI of the breast to be performed the first week in February. Assuming that is clear, we would do it yearly for the next 5 years.  I have also put her in for a chest x-ray to be performed just before she returns to see me in March. If everything is looking good at  that time I will see her again in August and then every 6 months with chest x-rays and breast MRIs yearly for 5 years.  I encouraged Abel to start a regular exercise program and she specifically interested in a stationary bike. We will discuss that further the next visit.   Chauncey Cruel, MD   10/11/2014  3:48 PM

## 2014-10-11 NOTE — Telephone Encounter (Signed)
per pof to sch pt appt-gave pt copy of sch and remimded to go have Chest X-ray-adv to call GI for screeening to sch MRI-*pt understood

## 2014-10-12 ENCOUNTER — Encounter: Payer: Self-pay | Admitting: Radiation Oncology

## 2014-10-12 DIAGNOSIS — Z510 Encounter for antineoplastic radiation therapy: Secondary | ICD-10-CM | POA: Diagnosis not present

## 2014-10-12 NOTE — Progress Notes (Signed)
3-D simulation note: The patient underwent 3-D simulation/treatment planning today for treatment to her right breast.  She is set up with medial lateral right breast tangents.  4 sets of multileaf collimators/treatment devices are employed to conform the field.  Dose volume histograms were obtained for the target structures and also avoidance structures including the lungs and heart.  We met our departmental guidelines.  I'm prescribing 5040 cGy in 28 sessions utilizing 6 MV photons.

## 2014-10-13 DIAGNOSIS — Z510 Encounter for antineoplastic radiation therapy: Secondary | ICD-10-CM | POA: Diagnosis not present

## 2014-10-17 ENCOUNTER — Ambulatory Visit
Admission: RE | Admit: 2014-10-17 | Discharge: 2014-10-17 | Disposition: A | Discharge status: Home / Self Care | Payer: BLUE CROSS/BLUE SHIELD | Source: Ambulatory Visit | Attending: Radiation Oncology | Admitting: Radiation Oncology

## 2014-10-17 DIAGNOSIS — Z510 Encounter for antineoplastic radiation therapy: Secondary | ICD-10-CM | POA: Diagnosis not present

## 2014-10-17 DIAGNOSIS — C50911 Malignant neoplasm of unspecified site of right female breast: Secondary | ICD-10-CM

## 2014-10-17 NOTE — Progress Notes (Signed)
Simulation verification note: The patient underwent simulation verification for treatment to her right breast.  Her isocenter is in good position and the multileaf collimators contour the treatment volume appropriately.

## 2014-10-18 ENCOUNTER — Ambulatory Visit
Admission: RE | Admit: 2014-10-18 | Discharge: 2014-10-18 | Disposition: A | Discharge status: Home / Self Care | Payer: BLUE CROSS/BLUE SHIELD | Source: Ambulatory Visit | Attending: Radiation Oncology | Admitting: Radiation Oncology

## 2014-10-18 DIAGNOSIS — Z510 Encounter for antineoplastic radiation therapy: Secondary | ICD-10-CM | POA: Diagnosis not present

## 2014-10-19 ENCOUNTER — Ambulatory Visit
Admission: RE | Admit: 2014-10-19 | Discharge: 2014-10-19 | Disposition: A | Discharge status: Home / Self Care | Payer: BLUE CROSS/BLUE SHIELD | Source: Ambulatory Visit | Attending: Radiation Oncology | Admitting: Radiation Oncology

## 2014-10-19 ENCOUNTER — Other Ambulatory Visit: Payer: Self-pay | Admitting: Oncology

## 2014-10-19 ENCOUNTER — Ambulatory Visit: Payer: BC Managed Care – PPO

## 2014-10-19 DIAGNOSIS — Z510 Encounter for antineoplastic radiation therapy: Secondary | ICD-10-CM | POA: Diagnosis not present

## 2014-10-19 DIAGNOSIS — Z853 Personal history of malignant neoplasm of breast: Secondary | ICD-10-CM

## 2014-10-20 ENCOUNTER — Ambulatory Visit
Admission: RE | Admit: 2014-10-20 | Discharge: 2014-10-20 | Disposition: A | Discharge status: Home / Self Care | Payer: BLUE CROSS/BLUE SHIELD | Source: Ambulatory Visit | Attending: Radiation Oncology | Admitting: Radiation Oncology

## 2014-10-20 DIAGNOSIS — Z510 Encounter for antineoplastic radiation therapy: Secondary | ICD-10-CM | POA: Diagnosis not present

## 2014-10-21 ENCOUNTER — Ambulatory Visit: Payer: BLUE CROSS/BLUE SHIELD

## 2014-10-24 ENCOUNTER — Ambulatory Visit
Admission: RE | Admit: 2014-10-24 | Discharge: 2014-10-24 | Disposition: A | Discharge status: Home / Self Care | Payer: BLUE CROSS/BLUE SHIELD | Source: Ambulatory Visit | Attending: Radiation Oncology | Admitting: Radiation Oncology

## 2014-10-24 ENCOUNTER — Telehealth: Payer: Self-pay | Admitting: Oncology

## 2014-10-24 ENCOUNTER — Encounter: Payer: Self-pay | Admitting: Radiation Oncology

## 2014-10-24 ENCOUNTER — Ambulatory Visit: Payer: BLUE CROSS/BLUE SHIELD

## 2014-10-24 VITALS — BP 142/92 | HR 83 | Temp 98.0°F | Ht 67.0 in | Wt 156.4 lb

## 2014-10-24 DIAGNOSIS — C50911 Malignant neoplasm of unspecified site of right female breast: Secondary | ICD-10-CM

## 2014-10-24 DIAGNOSIS — Z510 Encounter for antineoplastic radiation therapy: Secondary | ICD-10-CM | POA: Diagnosis not present

## 2014-10-24 NOTE — Progress Notes (Signed)
Weekly Management Note:  Site: Right breast Current Dose:  720  cGy Projected Dose: 5040  cGy  Narrative: The patient is seen today for routine under treatment assessment. CBCT/MVCT images/port films were reviewed. The chart was reviewed.   No complaints today.  She uses Radioplex gel.  Physical Examination:  Filed Vitals:   10/24/14 1624  BP: 142/92  Pulse: 83  Temp: 98 F (36.7 C)  .  Weight: 156 lb 6.4 oz (70.943 kg).  There is mild hyperpigmentation of the skin along the right breast.  No areas of desquamation.  Impression: Tolerating radiation therapy well.  Plan: Continue radiation therapy as planned.

## 2014-10-24 NOTE — Progress Notes (Signed)
Ms. Iten has received 4 fractions to his right breast.  Note hyperpigmentation in her right breast as compared to her left breast.  No voiced concerns.   Education today regarding potential for pain and soreness, and fatigue.  Given Radiaplex Gel with instructions to apply 2-3 times daily,with the caveat to apply any of this product at least 4 hours prior to her daily treatment.  Given Radiation Therapy and You booklet with the appropriate pages marked.  Informed that she will see Dr. Valere Dross every Monday, but if this changes, she will be notified in advance.  Given this RN's card.

## 2014-10-24 NOTE — Telephone Encounter (Signed)
due to bmdc 4/6 appt moved to AM. left message for pt and mailed new schedule - other appts remain the same.

## 2014-10-25 ENCOUNTER — Ambulatory Visit
Admission: RE | Admit: 2014-10-25 | Discharge: 2014-10-25 | Disposition: A | Discharge status: Home / Self Care | Payer: BLUE CROSS/BLUE SHIELD | Source: Ambulatory Visit | Attending: Radiation Oncology | Admitting: Radiation Oncology

## 2014-10-25 DIAGNOSIS — Z510 Encounter for antineoplastic radiation therapy: Secondary | ICD-10-CM | POA: Diagnosis not present

## 2014-10-26 ENCOUNTER — Ambulatory Visit
Admission: RE | Admit: 2014-10-26 | Discharge: 2014-10-26 | Disposition: A | Discharge status: Home / Self Care | Payer: BLUE CROSS/BLUE SHIELD | Source: Ambulatory Visit | Attending: Radiation Oncology | Admitting: Radiation Oncology

## 2014-10-26 DIAGNOSIS — Z510 Encounter for antineoplastic radiation therapy: Secondary | ICD-10-CM | POA: Diagnosis not present

## 2014-10-27 ENCOUNTER — Ambulatory Visit
Admission: RE | Admit: 2014-10-27 | Discharge: 2014-10-27 | Disposition: A | Discharge status: Home / Self Care | Payer: BLUE CROSS/BLUE SHIELD | Source: Ambulatory Visit | Attending: Radiation Oncology | Admitting: Radiation Oncology

## 2014-10-27 DIAGNOSIS — Z510 Encounter for antineoplastic radiation therapy: Secondary | ICD-10-CM | POA: Diagnosis not present

## 2014-10-28 ENCOUNTER — Ambulatory Visit
Admission: RE | Admit: 2014-10-28 | Discharge: 2014-10-28 | Disposition: A | Discharge status: Home / Self Care | Payer: BLUE CROSS/BLUE SHIELD | Source: Ambulatory Visit | Attending: Radiation Oncology | Admitting: Radiation Oncology

## 2014-10-28 DIAGNOSIS — Z510 Encounter for antineoplastic radiation therapy: Secondary | ICD-10-CM | POA: Diagnosis not present

## 2014-10-31 ENCOUNTER — Ambulatory Visit
Admission: RE | Admit: 2014-10-31 | Discharge: 2014-10-31 | Disposition: A | Discharge status: Home / Self Care | Payer: BLUE CROSS/BLUE SHIELD | Source: Ambulatory Visit | Attending: Radiation Oncology | Admitting: Radiation Oncology

## 2014-10-31 ENCOUNTER — Encounter: Payer: Self-pay | Admitting: Radiation Oncology

## 2014-10-31 VITALS — BP 160/98 | HR 80 | Temp 97.7°F | Ht 67.0 in | Wt 158.4 lb

## 2014-10-31 DIAGNOSIS — C50911 Malignant neoplasm of unspecified site of right female breast: Secondary | ICD-10-CM

## 2014-10-31 DIAGNOSIS — Z510 Encounter for antineoplastic radiation therapy: Secondary | ICD-10-CM | POA: Diagnosis not present

## 2014-10-31 NOTE — Progress Notes (Signed)
Ms. Kist has received 9 fractions to her right breast.  She denies any pain, but reports intermittent shooting pains.  Note hyperpigmentation of her breast, but skin remains soft and intact.

## 2014-10-31 NOTE — Progress Notes (Signed)
Weekly Management Note:  Site: Right breast Current Dose:  1620  cGy Projected Dose: 5040  cGy  Narrative: The patient is seen today for routine under treatment assessment. CBCT/MVCT images/port films were reviewed. The chart was reviewed.   She is without complaints today.  She uses Radioplex gel.  Physical Examination:  Filed Vitals:   10/31/14 1415  BP: 160/98  Pulse: 80  Temp: 97.7 F (36.5 C)  .  Weight: 158 lb 6.4 oz (71.85 kg).  There is mild hyperpigmentation the skin along the right breast.  No areas of dry desquamation.  Impression: Tolerating radiation therapy well.  Plan: Continue radiation therapy as planned.

## 2014-11-01 ENCOUNTER — Ambulatory Visit
Admission: RE | Admit: 2014-11-01 | Discharge: 2014-11-01 | Disposition: A | Discharge status: Home / Self Care | Payer: BLUE CROSS/BLUE SHIELD | Source: Ambulatory Visit | Attending: Radiation Oncology | Admitting: Radiation Oncology

## 2014-11-01 DIAGNOSIS — Z510 Encounter for antineoplastic radiation therapy: Secondary | ICD-10-CM | POA: Diagnosis not present

## 2014-11-02 ENCOUNTER — Ambulatory Visit
Admission: RE | Admit: 2014-11-02 | Discharge: 2014-11-02 | Disposition: A | Discharge status: Home / Self Care | Payer: BLUE CROSS/BLUE SHIELD | Source: Ambulatory Visit | Attending: Radiation Oncology | Admitting: Radiation Oncology

## 2014-11-02 DIAGNOSIS — Z510 Encounter for antineoplastic radiation therapy: Secondary | ICD-10-CM | POA: Diagnosis not present

## 2014-11-03 ENCOUNTER — Ambulatory Visit
Admission: RE | Admit: 2014-11-03 | Discharge: 2014-11-03 | Disposition: A | Discharge status: Home / Self Care | Payer: BLUE CROSS/BLUE SHIELD | Source: Ambulatory Visit | Attending: Radiation Oncology | Admitting: Radiation Oncology

## 2014-11-03 DIAGNOSIS — Z510 Encounter for antineoplastic radiation therapy: Secondary | ICD-10-CM | POA: Diagnosis not present

## 2014-11-04 ENCOUNTER — Ambulatory Visit
Admission: RE | Admit: 2014-11-04 | Discharge: 2014-11-04 | Disposition: A | Discharge status: Home / Self Care | Payer: BLUE CROSS/BLUE SHIELD | Source: Ambulatory Visit | Attending: Radiation Oncology | Admitting: Radiation Oncology

## 2014-11-04 DIAGNOSIS — Z510 Encounter for antineoplastic radiation therapy: Secondary | ICD-10-CM | POA: Diagnosis not present

## 2014-11-07 ENCOUNTER — Ambulatory Visit
Admission: RE | Admit: 2014-11-07 | Discharge: 2014-11-07 | Disposition: A | Discharge status: Home / Self Care | Payer: BLUE CROSS/BLUE SHIELD | Source: Ambulatory Visit | Attending: Radiation Oncology | Admitting: Radiation Oncology

## 2014-11-07 ENCOUNTER — Encounter: Payer: Self-pay | Admitting: Radiation Oncology

## 2014-11-07 VITALS — BP 151/88 | HR 80 | Temp 97.9°F | Resp 12 | Wt 157.6 lb

## 2014-11-07 DIAGNOSIS — C50911 Malignant neoplasm of unspecified site of right female breast: Secondary | ICD-10-CM

## 2014-11-07 DIAGNOSIS — Z510 Encounter for antineoplastic radiation therapy: Secondary | ICD-10-CM | POA: Diagnosis not present

## 2014-11-07 NOTE — Progress Notes (Signed)
She is currently in no pain.  Pt complains of fatigue .  Pt right breast- positive for Dryness and slight Hyperpigmentation.  Pt denies edema. Pt continues to apply Radiaplex as directed. BP 151/88 mmHg  Pulse 80  Temp(Src) 97.9 F (36.6 C) (Oral)  Resp 12  Wt 157 lb 9.6 oz (71.487 kg)  SpO2 100%

## 2014-11-07 NOTE — Progress Notes (Signed)
Weekly Management Note:  Site: Right breast Current Dose:  2520  cGy Projected Dose: 5040  cGy  Narrative: The patient is seen today for routine under treatment assessment. CBCT/MVCT images/port films were reviewed. The chart was reviewed.   She is without complaints today.  She uses Radioplex gel.  Physical Examination:  Filed Vitals:   11/07/14 1419  BP: 151/88  Pulse: 80  Temp: 97.9 F (36.6 C)  Resp: 12  .  Weight: 157 lb 9.6 oz (71.487 kg).  There is hyperpigmentation the skin along the right breast, no areas of desquamation.  Impression: Tolerating radiation therapy well.  Plan: Continue radiation therapy as planned.

## 2014-11-08 ENCOUNTER — Ambulatory Visit
Admission: RE | Admit: 2014-11-08 | Discharge: 2014-11-08 | Disposition: A | Discharge status: Home / Self Care | Payer: BLUE CROSS/BLUE SHIELD | Source: Ambulatory Visit | Attending: Radiation Oncology | Admitting: Radiation Oncology

## 2014-11-08 DIAGNOSIS — Z510 Encounter for antineoplastic radiation therapy: Secondary | ICD-10-CM | POA: Diagnosis not present

## 2014-11-09 ENCOUNTER — Ambulatory Visit
Admission: RE | Admit: 2014-11-09 | Discharge: 2014-11-09 | Disposition: A | Discharge status: Home / Self Care | Payer: BLUE CROSS/BLUE SHIELD | Source: Ambulatory Visit | Attending: Radiation Oncology | Admitting: Radiation Oncology

## 2014-11-09 DIAGNOSIS — Z510 Encounter for antineoplastic radiation therapy: Secondary | ICD-10-CM | POA: Diagnosis not present

## 2014-11-10 ENCOUNTER — Ambulatory Visit
Admission: RE | Admit: 2014-11-10 | Discharge: 2014-11-10 | Disposition: A | Discharge status: Home / Self Care | Payer: BLUE CROSS/BLUE SHIELD | Source: Ambulatory Visit | Attending: Radiation Oncology | Admitting: Radiation Oncology

## 2014-11-10 DIAGNOSIS — Z510 Encounter for antineoplastic radiation therapy: Secondary | ICD-10-CM | POA: Diagnosis not present

## 2014-11-11 ENCOUNTER — Ambulatory Visit
Admission: RE | Admit: 2014-11-11 | Discharge: 2014-11-11 | Disposition: A | Discharge status: Home / Self Care | Payer: BLUE CROSS/BLUE SHIELD | Source: Ambulatory Visit | Attending: Radiation Oncology | Admitting: Radiation Oncology

## 2014-11-11 DIAGNOSIS — Z510 Encounter for antineoplastic radiation therapy: Secondary | ICD-10-CM | POA: Diagnosis not present

## 2014-11-14 ENCOUNTER — Ambulatory Visit: Payer: BLUE CROSS/BLUE SHIELD

## 2014-11-14 ENCOUNTER — Ambulatory Visit
Admission: RE | Admit: 2014-11-14 | Discharge: 2014-11-14 | Disposition: A | Discharge status: Home / Self Care | Payer: BLUE CROSS/BLUE SHIELD | Source: Ambulatory Visit | Attending: Radiation Oncology | Admitting: Radiation Oncology

## 2014-11-14 ENCOUNTER — Ambulatory Visit: Payer: BLUE CROSS/BLUE SHIELD | Admitting: Radiation Oncology

## 2014-11-15 ENCOUNTER — Ambulatory Visit
Admission: RE | Admit: 2014-11-15 | Discharge: 2014-11-15 | Disposition: A | Discharge status: Home / Self Care | Payer: BLUE CROSS/BLUE SHIELD | Source: Ambulatory Visit | Attending: Radiation Oncology | Admitting: Radiation Oncology

## 2014-11-15 DIAGNOSIS — Z510 Encounter for antineoplastic radiation therapy: Secondary | ICD-10-CM | POA: Diagnosis not present

## 2014-11-16 ENCOUNTER — Ambulatory Visit
Admission: RE | Admit: 2014-11-16 | Discharge: 2014-11-16 | Disposition: A | Discharge status: Home / Self Care | Payer: BLUE CROSS/BLUE SHIELD | Source: Ambulatory Visit | Attending: Radiation Oncology | Admitting: Radiation Oncology

## 2014-11-16 ENCOUNTER — Encounter: Payer: Self-pay | Admitting: Radiation Oncology

## 2014-11-16 DIAGNOSIS — Z510 Encounter for antineoplastic radiation therapy: Secondary | ICD-10-CM | POA: Diagnosis not present

## 2014-11-16 DIAGNOSIS — C50911 Malignant neoplasm of unspecified site of right female breast: Secondary | ICD-10-CM

## 2014-11-16 NOTE — Progress Notes (Signed)
Weekly Management Note:  Site: Right breast Current Dose:  3600  cGy Projected Dose: 5040  cGy  Narrative: The patient is seen today for routine under treatment assessment. CBCT/MVCT images/port films were reviewed. The chart was reviewed.   She is without new complaints today.  She missed her treatment this past Monday because of the weather.  She uses Radioplex gel.  Physical Examination: There were no vitals filed for this visit..  Weight:  .  There is hyperpigmentation the skin along the right breast with no areas of desquamation.  Impression: Tolerating radiation therapy well.  Plan: Continue radiation therapy as planned.

## 2014-11-17 ENCOUNTER — Ambulatory Visit
Admission: RE | Admit: 2014-11-17 | Discharge: 2014-11-17 | Disposition: A | Discharge status: Home / Self Care | Payer: BLUE CROSS/BLUE SHIELD | Source: Ambulatory Visit | Attending: Radiation Oncology | Admitting: Radiation Oncology

## 2014-11-17 DIAGNOSIS — Z510 Encounter for antineoplastic radiation therapy: Secondary | ICD-10-CM | POA: Diagnosis not present

## 2014-11-18 ENCOUNTER — Ambulatory Visit
Admission: RE | Admit: 2014-11-18 | Discharge: 2014-11-18 | Disposition: A | Discharge status: Home / Self Care | Payer: BLUE CROSS/BLUE SHIELD | Source: Ambulatory Visit | Attending: Radiation Oncology | Admitting: Radiation Oncology

## 2014-11-18 DIAGNOSIS — Z510 Encounter for antineoplastic radiation therapy: Secondary | ICD-10-CM | POA: Diagnosis not present

## 2014-11-21 ENCOUNTER — Ambulatory Visit
Admission: RE | Admit: 2014-11-21 | Discharge: 2014-11-21 | Disposition: A | Discharge status: Home / Self Care | Payer: BLUE CROSS/BLUE SHIELD | Source: Ambulatory Visit | Attending: Radiation Oncology | Admitting: Radiation Oncology

## 2014-11-21 ENCOUNTER — Encounter: Payer: Self-pay | Admitting: Radiation Oncology

## 2014-11-21 VITALS — BP 149/92 | HR 77 | Temp 97.8°F | Resp 12 | Wt 157.5 lb

## 2014-11-21 DIAGNOSIS — L819 Disorder of pigmentation, unspecified: Secondary | ICD-10-CM | POA: Diagnosis not present

## 2014-11-21 DIAGNOSIS — L598 Other specified disorders of the skin and subcutaneous tissue related to radiation: Secondary | ICD-10-CM | POA: Diagnosis not present

## 2014-11-21 DIAGNOSIS — C50911 Malignant neoplasm of unspecified site of right female breast: Secondary | ICD-10-CM

## 2014-11-21 DIAGNOSIS — Z510 Encounter for antineoplastic radiation therapy: Secondary | ICD-10-CM | POA: Diagnosis not present

## 2014-11-21 MED ORDER — RADIAPLEXRX EX GEL
Freq: Once | CUTANEOUS | Status: AC
  Administered 2014-11-21: 17:00:00 via TOPICAL

## 2014-11-21 NOTE — Addendum Note (Signed)
Encounter addended by: Arlyss Repress, RN on: 11/21/2014  5:07 PM<BR>     Documentation filed: [employer], Inpatient MAR, Orders

## 2014-11-21 NOTE — Progress Notes (Signed)
She is currently in no pain.   Pt right breast- positive for Hyperpigmentation and breast tenderness.  Pt denies edema. Pt continues to apply Radiaplex as directed. BP 149/92 mmHg  Pulse 77  Temp(Src) 97.8 F (36.6 C) (Oral)  Resp 12  Wt 157 lb 8 oz (71.442 kg)  SpO2 100%

## 2014-11-21 NOTE — Progress Notes (Signed)
Weekly Management Note:  Site: Right breast Current Dose:  4140  cGy Projected Dose: 5040  cGy  Narrative: The patient is seen today for routine under treatment assessment. CBCT/MVCT images/port films were reviewed. The chart was reviewed.   She still doing well.  She does used Radioplex gel.  Physical Examination:  Filed Vitals:   11/21/14 1437  BP: 149/92  Pulse: 77  Temp: 97.8 F (36.6 C)  Resp: 12  .  Weight: 157 lb 8 oz (71.442 kg).  There is mild to moderate hyperpigmentation the skin with no areas of desquamation.  Impression: Tolerating radiation therapy well.  She will finish in one week.  Plan: Continue radiation therapy as planned.

## 2014-11-22 ENCOUNTER — Ambulatory Visit
Admission: RE | Admit: 2014-11-22 | Discharge: 2014-11-22 | Disposition: A | Discharge status: Home / Self Care | Payer: BLUE CROSS/BLUE SHIELD | Source: Ambulatory Visit | Attending: Radiation Oncology | Admitting: Radiation Oncology

## 2014-11-22 DIAGNOSIS — Z510 Encounter for antineoplastic radiation therapy: Secondary | ICD-10-CM | POA: Diagnosis not present

## 2014-11-23 ENCOUNTER — Ambulatory Visit
Admission: RE | Admit: 2014-11-23 | Discharge: 2014-11-23 | Disposition: A | Discharge status: Home / Self Care | Payer: BLUE CROSS/BLUE SHIELD | Source: Ambulatory Visit | Attending: Radiation Oncology | Admitting: Radiation Oncology

## 2014-11-23 DIAGNOSIS — Z510 Encounter for antineoplastic radiation therapy: Secondary | ICD-10-CM | POA: Diagnosis not present

## 2014-11-24 ENCOUNTER — Ambulatory Visit: Payer: BLUE CROSS/BLUE SHIELD

## 2014-11-24 DIAGNOSIS — Z510 Encounter for antineoplastic radiation therapy: Secondary | ICD-10-CM | POA: Diagnosis not present

## 2014-11-25 ENCOUNTER — Ambulatory Visit: Payer: BLUE CROSS/BLUE SHIELD

## 2014-11-25 DIAGNOSIS — Z510 Encounter for antineoplastic radiation therapy: Secondary | ICD-10-CM | POA: Diagnosis not present

## 2014-11-28 ENCOUNTER — Encounter: Payer: Self-pay | Admitting: Radiation Oncology

## 2014-11-28 ENCOUNTER — Ambulatory Visit
Admission: RE | Admit: 2014-11-28 | Discharge: 2014-11-28 | Disposition: A | Discharge status: Home / Self Care | Payer: BLUE CROSS/BLUE SHIELD | Source: Ambulatory Visit | Attending: Radiation Oncology | Admitting: Radiation Oncology

## 2014-11-28 ENCOUNTER — Ambulatory Visit: Payer: BLUE CROSS/BLUE SHIELD

## 2014-11-28 VITALS — BP 140/84 | HR 83 | Temp 97.9°F | Resp 12 | Wt 157.6 lb

## 2014-11-28 DIAGNOSIS — Z510 Encounter for antineoplastic radiation therapy: Secondary | ICD-10-CM | POA: Diagnosis not present

## 2014-11-28 DIAGNOSIS — C50911 Malignant neoplasm of unspecified site of right female breast: Secondary | ICD-10-CM

## 2014-11-28 NOTE — Progress Notes (Signed)
[facility] End of Treatment Note  Name:Seryna EVOLETT SOMARRIBA  Date: 11/28/2014 SPQ:[id number] DOB:1965-11-08   Status:outpatient    CC: Lilian Coma, MD  Dr. Jackolyn Confer  REFERRING PHYSICIAN:   Dr. 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 Loveall tolerated her treatment well with the expected degree of radiation dermatitis/hyperpigmentation the skin by completion of therapy.  She had dry desquamation but no areas of moist desquamation by completion of therapy.  She uses Radioplex gel during her course of treatment.                         PLAN: Routine followup in one month. Patient instructed to call if questions or worsening complaints in interim.

## 2014-11-28 NOTE — Progress Notes (Signed)
Weekly Management Note:  Site: Right breast Current Dose:  5040  cGy Projected Dose: 5040  cGy  Narrative: The patient is seen today for routine under treatment assessment. CBCT/MVCT images/port films were reviewed. The chart was reviewed.   Radiation therapy is completed today.  She does report some dryness of her skin.  She uses Radioplex gel.  No significant pruritus.  Physical Examination:  Filed Vitals:   11/28/14 1425  BP: 140/84  Pulse: 83  Temp: 97.9 F (36.6 C)  Resp: 12  .  Weight: 157 lb 9.6 oz (71.487 kg).  There is marked hyperpigmentation the skin with patchy dry desquamation along the lower axilla and inframammary area region as expected.  No areas of moist desquamation.  Impression:  Radiation therapy completed.  Plan: Follow-up visit in one month.

## 2014-11-28 NOTE — Progress Notes (Signed)
She rates her pain as a 5 on a scale of 0-10. intermittent and sharp over right axilla. Pt right breast- positive for Dryness, Hyperpigmentation, Pruritus and dry desquamation.  Pt denies edema. Pt continues to apply Radiaplex as directed. BP 140/84 mmHg  Pulse 83  Temp(Src) 97.9 F (36.6 C) (Oral)  Resp 12  Wt 157 lb 9.6 oz (71.487 kg)  SpO2 100%  Completed treatment today

## 2014-12-28 ENCOUNTER — Other Ambulatory Visit: Payer: BLUE CROSS/BLUE SHIELD

## 2014-12-29 ENCOUNTER — Other Ambulatory Visit: Payer: Self-pay | Admitting: *Deleted

## 2014-12-29 ENCOUNTER — Ambulatory Visit: Payer: BLUE CROSS/BLUE SHIELD | Admitting: Radiation Oncology

## 2014-12-29 DIAGNOSIS — C50911 Malignant neoplasm of unspecified site of right female breast: Secondary | ICD-10-CM

## 2014-12-30 ENCOUNTER — Other Ambulatory Visit: Payer: Self-pay | Admitting: *Deleted

## 2014-12-30 ENCOUNTER — Telehealth: Payer: Self-pay | Admitting: *Deleted

## 2014-12-30 ENCOUNTER — Other Ambulatory Visit (HOSPITAL_BASED_OUTPATIENT_CLINIC_OR_DEPARTMENT_OTHER): Payer: BLUE CROSS/BLUE SHIELD

## 2014-12-30 DIAGNOSIS — C50911 Malignant neoplasm of unspecified site of right female breast: Secondary | ICD-10-CM

## 2014-12-30 LAB — COMPREHENSIVE METABOLIC PANEL (CC13)
ALT: 19 U/L (ref 0–55)
ANION GAP: 9 meq/L (ref 3–11)
AST: 17 U/L (ref 5–34)
Albumin: 3.7 g/dL (ref 3.5–5.0)
Alkaline Phosphatase: 91 U/L (ref 40–150)
BUN: 7.1 mg/dL (ref 7.0–26.0)
CALCIUM: 9.1 mg/dL (ref 8.4–10.4)
CHLORIDE: 105 meq/L (ref 98–109)
CO2: 26 meq/L (ref 22–29)
Creatinine: 0.7 mg/dL (ref 0.6–1.1)
Glucose: 82 mg/dl (ref 70–140)
Potassium: 4.1 mEq/L (ref 3.5–5.1)
Sodium: 140 mEq/L (ref 136–145)
Total Bilirubin: 0.28 mg/dL (ref 0.20–1.20)
Total Protein: 7.4 g/dL (ref 6.4–8.3)

## 2014-12-30 LAB — CBC WITH DIFFERENTIAL/PLATELET
BASO%: 0.7 % (ref 0.0–2.0)
Basophils Absolute: 0 10*3/uL (ref 0.0–0.1)
EOS ABS: 0.1 10*3/uL (ref 0.0–0.5)
EOS%: 1.8 % (ref 0.0–7.0)
HCT: 34.8 % (ref 34.8–46.6)
HGB: 11.2 g/dL — ABNORMAL LOW (ref 11.6–15.9)
LYMPH%: 24.1 % (ref 14.0–49.7)
MCH: 26.8 pg (ref 25.1–34.0)
MCHC: 32.2 g/dL (ref 31.5–36.0)
MCV: 83 fL (ref 79.5–101.0)
MONO#: 0.5 10*3/uL (ref 0.1–0.9)
MONO%: 11.8 % (ref 0.0–14.0)
NEUT%: 61.6 % (ref 38.4–76.8)
NEUTROS ABS: 2.6 10*3/uL (ref 1.5–6.5)
PLATELETS: 330 10*3/uL (ref 145–400)
RBC: 4.2 10*6/uL (ref 3.70–5.45)
RDW: 14 % (ref 11.2–14.5)
WBC: 4.3 10*3/uL (ref 3.9–10.3)
lymph#: 1 10*3/uL (ref 0.9–3.3)

## 2014-12-30 NOTE — Telephone Encounter (Signed)
This RN returned call to pt per her message inquiring " do I need to obtain a CXR prior to my appointment with Dr Jana Hakim next week ?"  Per last dictation and order in EPIC- this RN left a detailed message on pt's identified cell VM to proceed with CXR.

## 2015-01-02 ENCOUNTER — Telehealth: Payer: Self-pay | Admitting: *Deleted

## 2015-01-02 ENCOUNTER — Ambulatory Visit (HOSPITAL_COMMUNITY)
Admission: RE | Admit: 2015-01-02 | Discharge: 2015-01-02 | Disposition: A | Discharge status: Home / Self Care | Payer: BLUE CROSS/BLUE SHIELD | Source: Ambulatory Visit | Attending: Oncology | Admitting: Oncology

## 2015-01-02 ENCOUNTER — Other Ambulatory Visit: Payer: Self-pay | Admitting: *Deleted

## 2015-01-02 DIAGNOSIS — C50911 Malignant neoplasm of unspecified site of right female breast: Secondary | ICD-10-CM

## 2015-01-02 DIAGNOSIS — Z853 Personal history of malignant neoplasm of breast: Secondary | ICD-10-CM | POA: Insufficient documentation

## 2015-01-02 NOTE — Telephone Encounter (Signed)
CT order with contrast per MD Magrinat

## 2015-01-02 NOTE — Telephone Encounter (Signed)
This RN spoke with pt per request of MD to obtain CT of chest prior to visit on Wednesday due to area of concern on CXR obtained today.  Pt understands CT is to be done at [facility] at 830 am tomorrow with instructions to be NPO 4 hours prior to scan. This RN informed pt go to valet parking and proceed to the radiology department from there.  Results also discussed with no further questions at this time.

## 2015-01-03 ENCOUNTER — Ambulatory Visit (HOSPITAL_COMMUNITY)
Admission: RE | Admit: 2015-01-03 | Discharge: 2015-01-03 | Disposition: A | Discharge status: Home / Self Care | Payer: BLUE CROSS/BLUE SHIELD | Source: Ambulatory Visit | Attending: Oncology | Admitting: Oncology

## 2015-01-03 DIAGNOSIS — C50911 Malignant neoplasm of unspecified site of right female breast: Secondary | ICD-10-CM | POA: Diagnosis present

## 2015-01-03 DIAGNOSIS — Z923 Personal history of irradiation: Secondary | ICD-10-CM | POA: Diagnosis not present

## 2015-01-03 MED ORDER — IOHEXOL 300 MG/ML  SOLN
80.0000 mL | Freq: Once | INTRAMUSCULAR | Status: AC | PRN
  Administered 2015-01-03: 80 mL via INTRAVENOUS

## 2015-01-04 ENCOUNTER — Ambulatory Visit (HOSPITAL_BASED_OUTPATIENT_CLINIC_OR_DEPARTMENT_OTHER): Payer: BLUE CROSS/BLUE SHIELD | Admitting: Oncology

## 2015-01-04 ENCOUNTER — Other Ambulatory Visit: Payer: Self-pay | Admitting: Oncology

## 2015-01-04 ENCOUNTER — Telehealth: Payer: Self-pay | Admitting: Oncology

## 2015-01-04 VITALS — BP 158/89 | HR 82 | Temp 97.7°F | Resp 18 | Ht 67.0 in | Wt 156.0 lb

## 2015-01-04 DIAGNOSIS — C50911 Malignant neoplasm of unspecified site of right female breast: Secondary | ICD-10-CM | POA: Diagnosis not present

## 2015-01-04 DIAGNOSIS — Z1231 Encounter for screening mammogram for malignant neoplasm of breast: Secondary | ICD-10-CM

## 2015-01-04 NOTE — Telephone Encounter (Signed)
per pof to sch pt appt-per pt req to mail sch

## 2015-01-04 NOTE — Progress Notes (Signed)
[place]  [phone] [phone]) [phone]     ID: Stacey Hinton DOB: 13-Jul-1966  MR#: [id number]  RCB#:[id number]  Patient Care Team: Jonathon Jordan, MD as PCP - General (Family Medicine) Chauncey Cruel, MD as Consulting Physician (Oncology) OTHER MD: Jackolyn Confer, Arloa Koh, Crissie Reese  CHIEF COMPLAINT: Malignant phyllodes tumor  CURRENT TREATMENT: Observation   HISTORY OF PRESENT ILLNESS: From the original intake note:  The patient tells me Dr. Madaline Brilliant noted a mass in her right breast during routine exam in the fall of 2012. He set her up for mammography and ultrasonography at the breast center, performed 06/14/2011. The palpable mass was in the outer right breast in compression views showed a partially circumscribed oval mass in that area. This was at the 9:00 position 6 cm from the nipple by physical exam. Ultrasound showed a circumscribed lobulated oval mass measuring 2.3 cm.  This was felt to be most likely a fibroadenoma and on 09/09/2011 this was confirmed by biopsy of the right breast mass, which showed ([id number]) a fibroadenoma with no evidence of malignancy.  The patient underwent a digital screening mammography 10/12/2012 and 10/14/2013 which showed no suspicious findings. The patient's breast density was noted to the category D. despite this, when Dr. table and examined the patient in February 2015 it seemed to him that the mass had grown some. The patient was referred to Dr. Lynwood Dawley for further evaluation and he confirmed that the patient's left breast was larger than the right and that there was a 7 cm right breast mass with tenting up of the skin, but without inflammatory changes. Biopsy of this mass was obtained 02/14/2014, and now it showed ([id number]) a biphasic stromal lesion favored to be a phyllodes tumor.  With this information and with the assistance of Dr. [name] the patient underwent right partial mastectomy with  immediate latissimus flap and nipple repositioning on 07/04/2014. The pathology from that procedure ([id number]) showed a high-grade phyllodes tumor measuring 8.3 cm. There was expensive stromal overgrowth, significant cytologic atypia, increased mitotic activity and a focally infiltrative border. The tumor was focally 2 mm from the deep margin.   At the multidisciplinary breast cancer conference 07/13/2014 this case was discussed. The deep margin is down to muscle and therefore cannot be improved. Staging studies were suggested to rule out metastatic disease, and it was felt that radiation should be strongly considered. Follow-up with MRI was also discussed, since the patient's mammograms had been essentially noninformative.  The patient's subsequent history is as detailed below  INTERVAL HISTORY: Stacey Hinton  returns today for follow-up of her phyllodes tumor. The interval history is unremarkable. Occasionally she has shooting pains over the surgical breast, but these are rare. She does not have constant soreness or other discomfort. She does not feel she is restricted in anyway by the surgery she has undergone, and when asked what her worse problem is, she just doesn't have one.  REVIEW OF SYSTEMS:  A detailed review of systems today was otherwise entirely negative.  PAST MEDICAL HISTORY: Past Medical History  Diagnosis Date  . Hypertension   . 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 Breast cancer   . 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 Cesarean section  06/16/98, 03/06/06  . Mastectomy, partial Right 07/04/2014    WITH RECONSTRUCTION    DR [name]  . Latissimus flap  to breast Right 07/04/2014    Procedure:  LATISSIMUS FLAP FOR RIGHT BREAST RECONSTRUCTION ;  Surgeon: Crissie Reese, MD;  Location: [facility] [place];  Service: Plastics;  Laterality: Right;  . Mastectomy, partial Right 07/04/2014     Procedure: RIGHT MASTECTOMY PARTIAL;  Surgeon: Jackolyn Confer, MD;  Location: [place];  Service: General;  Laterality: Right;    FAMILY HISTORY Family History  Problem Relation Age of Onset  . Cancer Maternal Aunt 73    breast  . Hypertension Mother    the patient's parents are still living. She is an only child. There is no history of cancer in the family to her knowledge  GYNECOLOGIC HISTORY:  Patient's last menstrual period was 12/17/2014. Menarche age 57, first live birth age 55, the patient is Stacey Hinton P2. She is still menstruating regularly. 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 Her husband Stacey Hinton is in the service department of a local company. Daughter Stacey Hinton, 61, and son  Stacey Hinton, 17, are at home.      ADVANCED DIRECTIVES: Not in place    HEALTH MAINTENANCE: History  Substance Use Topics  . Smoking status: Never Smoker   . Smokeless tobacco: Never Used  . Alcohol Use: Yes     Comment: occ     Colonoscopy:   PAP:  Bone density:  Lipid panel:  No Known Allergies  Current Outpatient Prescriptions  Medication Sig Dispense Refill  . amLODipine (NORVASC) 5 MG tablet Take 5 mg by mouth daily.    Marland Kitchen HYDROmorphone (DILAUDID) 2 MG tablet Take 1-2 tablets (2-4 mg total) by mouth every 4 (four) hours as needed for moderate pain. 40 tablet 0  . losartan (COZAAR) 50 MG tablet Take 50 mg by mouth daily.    . methocarbamol (ROBAXIN) 500 MG tablet Take 1 tablet (500 mg total) by mouth 4 (four) times daily. 40 tablet 1   No current facility-administered medications for this visit.    OBJECTIVE: middle-aged African-American woman who appears well Filed Vitals:   01/04/15 1041  BP: 158/89  Pulse: 82  Temp: 97.7 F (36.5 C)  Resp: 18     Body mass index is 24.43 kg/(m^2).    ECOG FS:0 - Asymptomatic  Sclerae unicteric, pupils round and equal Oropharynx clear, good dentition No cervical or  supraclavicular adenopathy Lungs no rales or rhonchi Heart regular rate and rhythm Abd soft, nontender, positive bowel sounds MSK no focal spinal tenderness, no upper extremity lymphedema Neuro: nonfocal, well oriented, appropriate affect Breasts: The right breast is status post lumpectomy and latissimus flap reconstruction. Also status post radiation. The hyperpigmentation is already resolving. The cosmetic result is excellent. The right axilla is benign. The left breast is unremarkable  LAB RESULTS:  CMP     Component Value Date/Time   NA 140 12/30/2014 1237   NA 138 06/24/2014 1358   K 4.1 12/30/2014 1237   K 3.6* 06/24/2014 1358   CL 101 06/24/2014 1358   CO2 26 12/30/2014 1237   CO2 26 06/24/2014 1358   GLUCOSE 82 12/30/2014 1237   GLUCOSE 88 06/24/2014 1358   BUN 7.1 12/30/2014 1237   BUN 6 06/24/2014 1358   CREATININE 0.7 12/30/2014 1237   CREATININE 0.54 06/24/2014 1358   CALCIUM 9.1 12/30/2014 1237   CALCIUM 8.8 06/24/2014 1358   PROT 7.4 12/30/2014 1237   PROT 7.7 06/24/2014 1358   ALBUMIN 3.7 12/30/2014 1237  ALBUMIN 3.9 06/24/2014 1358   AST 17 12/30/2014 1237   AST 16 06/24/2014 1358   ALT 19 12/30/2014 1237   ALT 11 06/24/2014 1358   ALKPHOS 91 12/30/2014 1237   ALKPHOS 82 06/24/2014 1358   BILITOT 0.28 12/30/2014 1237   BILITOT 0.2* 06/24/2014 1358   GFRNONAA >90 06/24/2014 1358   GFRAA >90 06/24/2014 1358    I No results found for: SPEP  Lab Results  Component Value Date   WBC 4.3 12/30/2014   NEUTROABS 2.6 12/30/2014   HGB 11.2* 12/30/2014   HCT 34.8 12/30/2014   MCV 83.0 12/30/2014   PLT 330 12/30/2014      Chemistry      Component Value Date/Time   NA 140 12/30/2014 1237   NA 138 06/24/2014 1358   K 4.1 12/30/2014 1237   K 3.6* 06/24/2014 1358   CL 101 06/24/2014 1358   CO2 26 12/30/2014 1237   CO2 26 06/24/2014 1358   BUN 7.1 12/30/2014 1237   BUN 6 06/24/2014 1358   CREATININE 0.7 12/30/2014 1237   CREATININE 0.54 06/24/2014  1358      Component Value Date/Time   CALCIUM 9.1 12/30/2014 1237   CALCIUM 8.8 06/24/2014 1358   ALKPHOS 91 12/30/2014 1237   ALKPHOS 82 06/24/2014 1358   AST 17 12/30/2014 1237   AST 16 06/24/2014 1358   ALT 19 12/30/2014 1237   ALT 11 06/24/2014 1358   BILITOT 0.28 12/30/2014 1237   BILITOT 0.2* 06/24/2014 1358       No results found for: LABCA2  No components found for: LABCA125  No results for input(s): INR in the last 168 hours.  Urinalysis No results found for: COLORURINE  STUDIES: Dg Chest 2 View  01/02/2015   CLINICAL DATA:  Malignant fluid East tumor of the breast question metastatic disease the lung  EXAM: CHEST  2 VIEW  COMPARISON:  Chest radiograph 06/24/2014, chest CT 07/15/2014  FINDINGS: Enlargement of cardiac silhouette.  Mediastinal contours and pulmonary vascularity normal.  New poorly defined opacity in the RIGHT upper lobe could represent infiltrate or tumor.  Area in question measures approximately 6.0 x 3.0 cm in size.  Remaining lungs clear.  No pleural effusion or pneumothorax.  Osseous structures unremarkable.  IMPRESSION: New poorly defined vague opacity in RIGHT upper lobe question infiltrate versus tumor; CT chest with contrast recommended to evaluate.  These results will be called to the ordering clinician or representative by the Radiologist Assistant, and communication documented in the PACS or zVision Dashboard.   Electronically Signed   By: Lavonia Dana M.D.   On: 01/02/2015 14:41   Ct Chest W Contrast  01/03/2015   CLINICAL DATA:  Right breast carcinoma. Status post radiation therapy.  EXAM: CT CHEST WITH CONTRAST  TECHNIQUE: Multidetector CT imaging of the chest was performed during intravenous contrast administration.  CONTRAST:  75mL OMNIPAQUE IOHEXOL 300 MG/ML  SOLN  COMPARISON:  CT 07/15/2014  FINDINGS: Mediastinum/Nodes: No axillary or supraclavicular lymphadenopathy. No mediastinal or hilar lymphadenopathy. No pericardial fluid. Esophagus is  normal  Lungs/Pleura: There is a new linear band of consolidation in the periphery of the right upper lobe consistent radiation change (image 17 from series 3). Mild reticular nodular pattern in the inferior right middle lobe is also new (image 42, series 30>  Upper abdomen: Limited view of the liver, kidneys, pancreas are unremarkable. Normal adrenal glands.  Musculoskeletal: Postsurgical change in the right chest wall consistent lumpectomy and lymphadenectomy. No aggressive osseous  lesion.  IMPRESSION: 1. New band of consolidation in the right upper lobe consists with radiation change. 2. Reticular nodular pattern in the inferior right middle lobe is likely post infectious or inflammatory. 3. Postsurgical change in the right chest wall / breast.   Electronically Signed   By: Suzy Bouchard M.D.   On: 01/03/2015 10:22    ASSESSMENT: 49 y.o. [place] woman   (1) status post biopsy of a right upper quadrant breast mass 09/09/2011 showing fibroadenoma.  (2) biopsy of an enlarging mass in the right breast upper outer quadrant 02/14/2014 showed a biphasic stromal/epithelial lesion.  (3) status post right lumpectomy 07/04/2014 for an 8.3 cm malignant phyllodes tumor with extensive stromal overgrowth, significant cytologic atypia, significant mitotic activity, and a focally infiltrative border. The closest margin (posterior) is 2 mm.  (a) she had an immediate latissimus flap reconstruction  (4) Adjuvant radiation completed to 20 06/20/2015: Right breast 5040 cGy in 28 sessions  (5) category D breast density   PLAN: [place]  has recovered nicely from her surgery. I went over the CT scan with her at all we are seeing is post radiation change and probably a little bit of inflammatory change perhaps due to allergies.  I will do a chest x-ray before her return visit here which will be in January. She is going to be seeing both her surgeons in May, and I suggested she see Dr. Cheron Schaumann in August and  Dr. Ronita Hipps in October. That way she is basically sitting in M.D. every 3 months without duplication.  When she has her mammography next year, April 2017, I will request an MRI of the breast at that time. We will also make sure the mammogram is obtained with 3-D.  She has a good understanding of the overall plan. She agrees with it. She will call with any problems that may develop before her next visit here.   Chauncey Cruel, MD   01/04/2015 10:50 AM

## 2015-01-05 ENCOUNTER — Other Ambulatory Visit (HOSPITAL_COMMUNITY): Payer: BLUE CROSS/BLUE SHIELD

## 2015-01-06 ENCOUNTER — Encounter: Payer: Self-pay | Admitting: Radiation Oncology

## 2015-01-10 ENCOUNTER — Ambulatory Visit
Admission: RE | Admit: 2015-01-10 | Discharge: 2015-01-10 | Disposition: A | Discharge status: Home / Self Care | Payer: BLUE CROSS/BLUE SHIELD | Source: Ambulatory Visit | Attending: Radiation Oncology | Admitting: Radiation Oncology

## 2015-01-10 DIAGNOSIS — C50911 Malignant neoplasm of unspecified site of right female breast: Secondary | ICD-10-CM

## 2015-01-10 HISTORY — DX: Personal history of irradiation: Z92.3

## 2015-01-10 NOTE — Progress Notes (Signed)
CC: Dr. Cristy Hilts, Dr. Jackolyn Confer, Dr. Gunnar Bulla Magrinat  Follow-up note:  Stacey Hinton returns today approximately 7 weeks following completion of radiation therapy following conservative surgery and oncoplastic reconstruction in the management of her malignant phyllodes tumor of the right breast.  She is without complaints today.  She is pleased with her cosmesis.  She saw Dr. Jana Hakim last week and she will see Dr. Zella Richer in early May.  Dr. Dr. Jana Hakim  will see her in February 2017.  Physical examination: Alert and oriented.There were no vitals filed for this visit. Head and neck examination: Grossly unremarkable.  Nodes: Without palpable cervical, supraclavicular, or axillary lymphadenopathy.  Breasts: There is marked hyperpigmentation along the right breast with dry desquamation.  There is mild thickening of the right breast.  No masses are appreciated.  Left breast without masses or lesions.  Extremities: Without edema.  Impression: Satisfactory progress.  I believe that we had discussed, in our Wednesday morning conference, obtaining a baseline MRI scan for future follow-up, and maybe alternating this with routine mammography.  Her last mammography was 15 months ago in January 2015.  I defer to Dr. Zella Richer regarding scheduling of her follow-up imaging.  She'll see him on May 2 for a follow-up visit.  Plan: Follow-up with Dr. Zella Richer on May 2 and Dr. Jana Hakim in February 2017.  I will see her back for a follow-up visit in December 2016.

## 2015-01-10 NOTE — Progress Notes (Signed)
Stacey Hinton here for fu s/p xrt to her right breast.  She has mild hyperpigmentation and is please with the appearance of her skin st this time.  She denies any fatigue.

## 2015-01-24 ENCOUNTER — Ambulatory Visit: Payer: BLUE CROSS/BLUE SHIELD

## 2015-01-24 ENCOUNTER — Ambulatory Visit
Admission: RE | Admit: 2015-01-24 | Discharge: 2015-01-24 | Disposition: A | Discharge status: Home / Self Care | Payer: BLUE CROSS/BLUE SHIELD | Source: Ambulatory Visit | Attending: Oncology | Admitting: Oncology

## 2015-01-24 DIAGNOSIS — Z853 Personal history of malignant neoplasm of breast: Secondary | ICD-10-CM

## 2015-01-30 ENCOUNTER — Encounter: Payer: Self-pay | Admitting: General Surgery

## 2015-01-30 NOTE — Progress Notes (Signed)
Stacey Hinton 01/30/2015 2:51 PM Location: [facility] Patient #: [id number] DOB: Sep 20, 1966 Married / Language: English / Race: Black or African American Female History of Present Illness Stacey Hollingshead MD; 01/30/2015 3:14 PM) Patient words: breast f/u.  The patient is a 49 year old female    Note: Right partial mastectomy and immediate reconstruction  Date: 07/04/14  Pathology: Malignant phyllodes tumor  History: She is here for a follow-up visit and examination. She completed her radiation treatments at the end of February. She is not noticed any breast masses on the right or left side. Bilateral diagnostic mammogram demonstrated expected postoperative changes in the right breast. There were no suspicious findings.  Allergies (Sonya Bynum, CMA; 01/30/2015 2:52 PM) No Known Drug Allergies 07/20/2014  Medication History (Sonya Bynum, CMA; 01/30/2015 2:53 PM) Docusate Sodium (100MG  Capsule, Oral) Active. Medications Reconciled    Vitals (Sonya Bynum CMA; 01/30/2015 2:52 PM) 01/30/2015 2:51 PM Weight: 156 lb Height: 67in Body Surface Area: 1.83 m Body Mass Index: 24.43 kg/m Temp.: 97.4F(Temporal)  Pulse: 83 (Regular)  BP: 130/78 (Sitting, Left Arm, Standard)     Physical Exam Stacey Hollingshead MD; 01/30/2015 3:13 PM)  The physical exam findings are as follows: Note:General-well-developed well-nourished distress.  Right breast-lateral scar is noted. There are no palpable masses present. No suspicious skin changes. Skin is slightly darker on the right side versus the left side.  Left breast-no palpable masses or suspicious skin changes.  Lymph nodes-no palpable axillary or supraclavicular adenopathy    Assessment & Plan Stacey Hollingshead MD; 01/30/2015 3:14 PM)  MALIGNANT PHYLLODES TUMOR OF BREAST, RIGHT (174.9  C50.911) Impression: She has completed radiation therapy. No clinical evidence of recurrence. 3-D mammogram and MRI are  planned for sometime next spring.  Plan: Return visit for physical examination in 3 months.  Stacey Confer, MD

## 2015-06-30 ENCOUNTER — Other Ambulatory Visit: Payer: Self-pay | Admitting: Gastroenterology

## 2015-07-02 IMAGING — CR DG CHEST 2V
2 series · 2 of 2 positions shown · non-contrast
Comparison: Chest radiograph 06/24/2014, chest CT 07/15/2014

CLINICAL DATA: Malignant fluid East tumor of the breast question
metastatic disease the lung

EXAM:
CHEST  2 VIEW

[w chest pa]
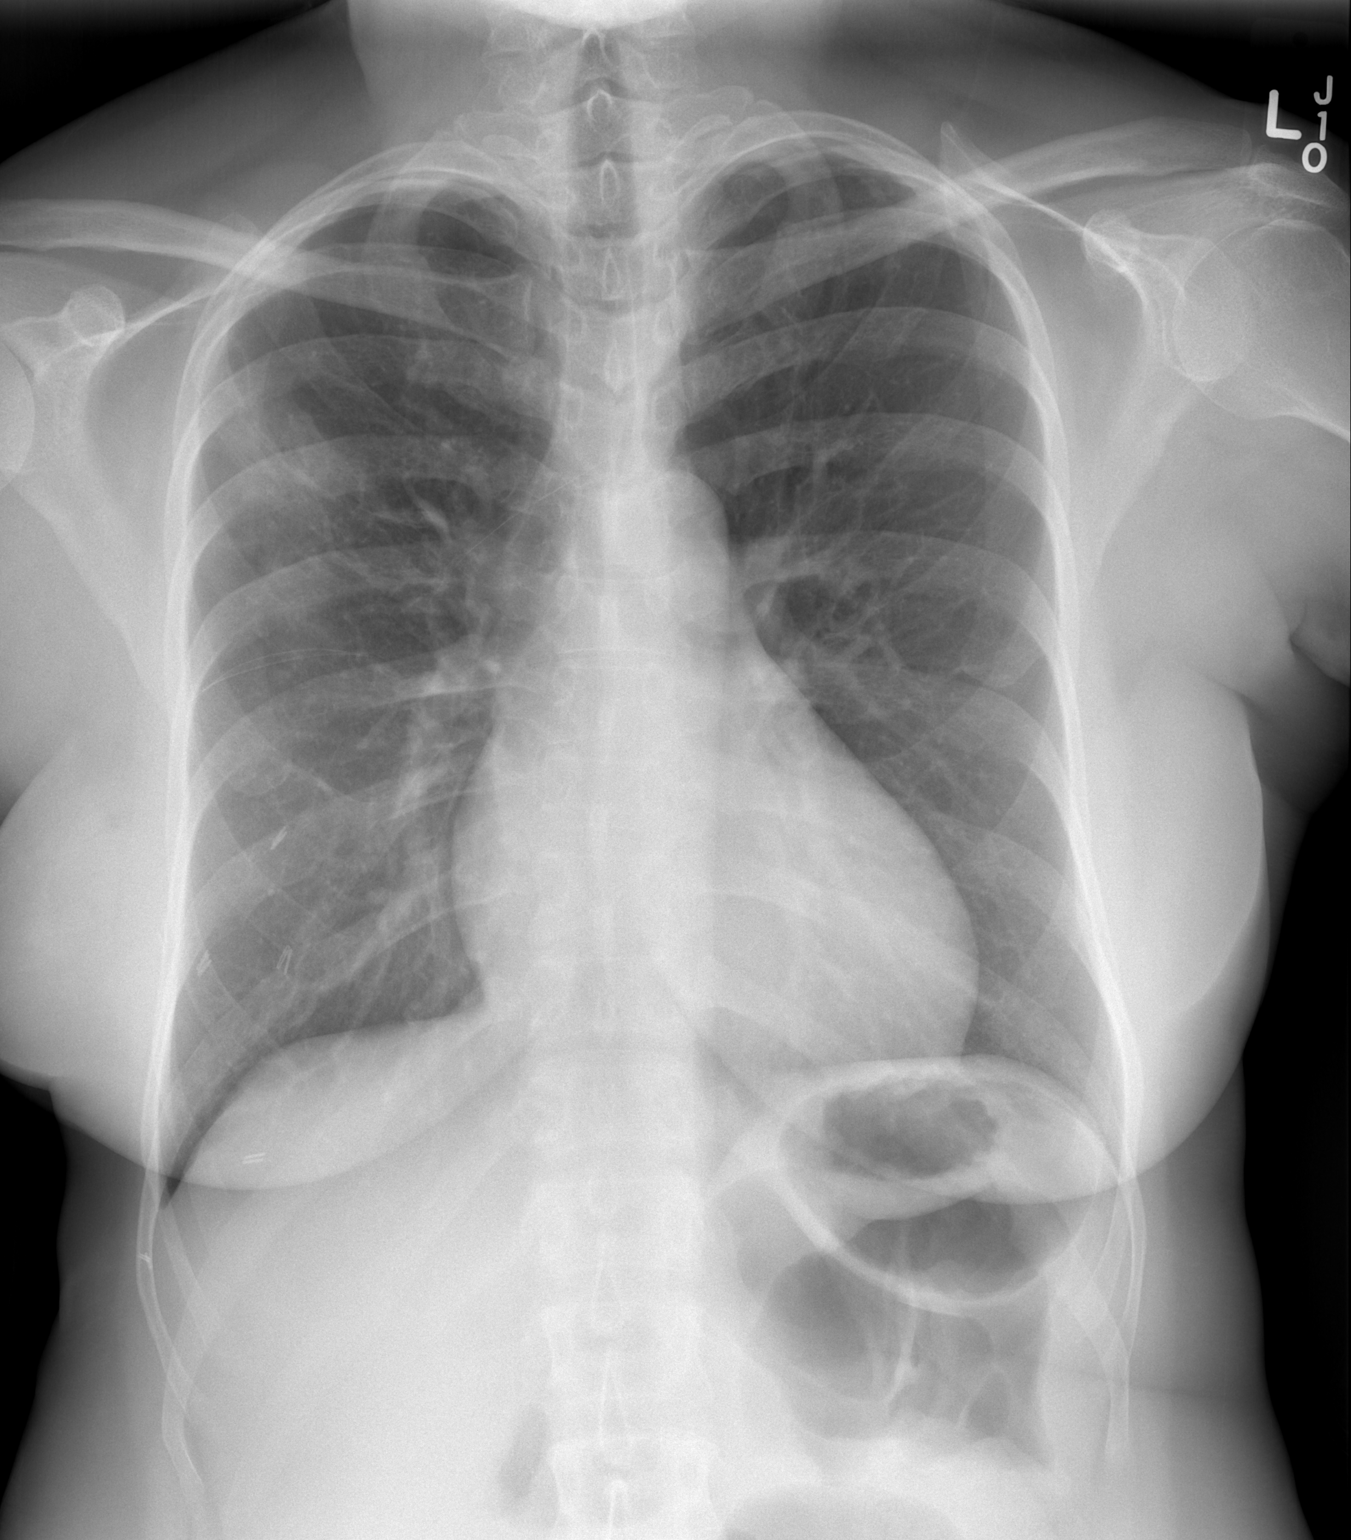

[w chest lat]
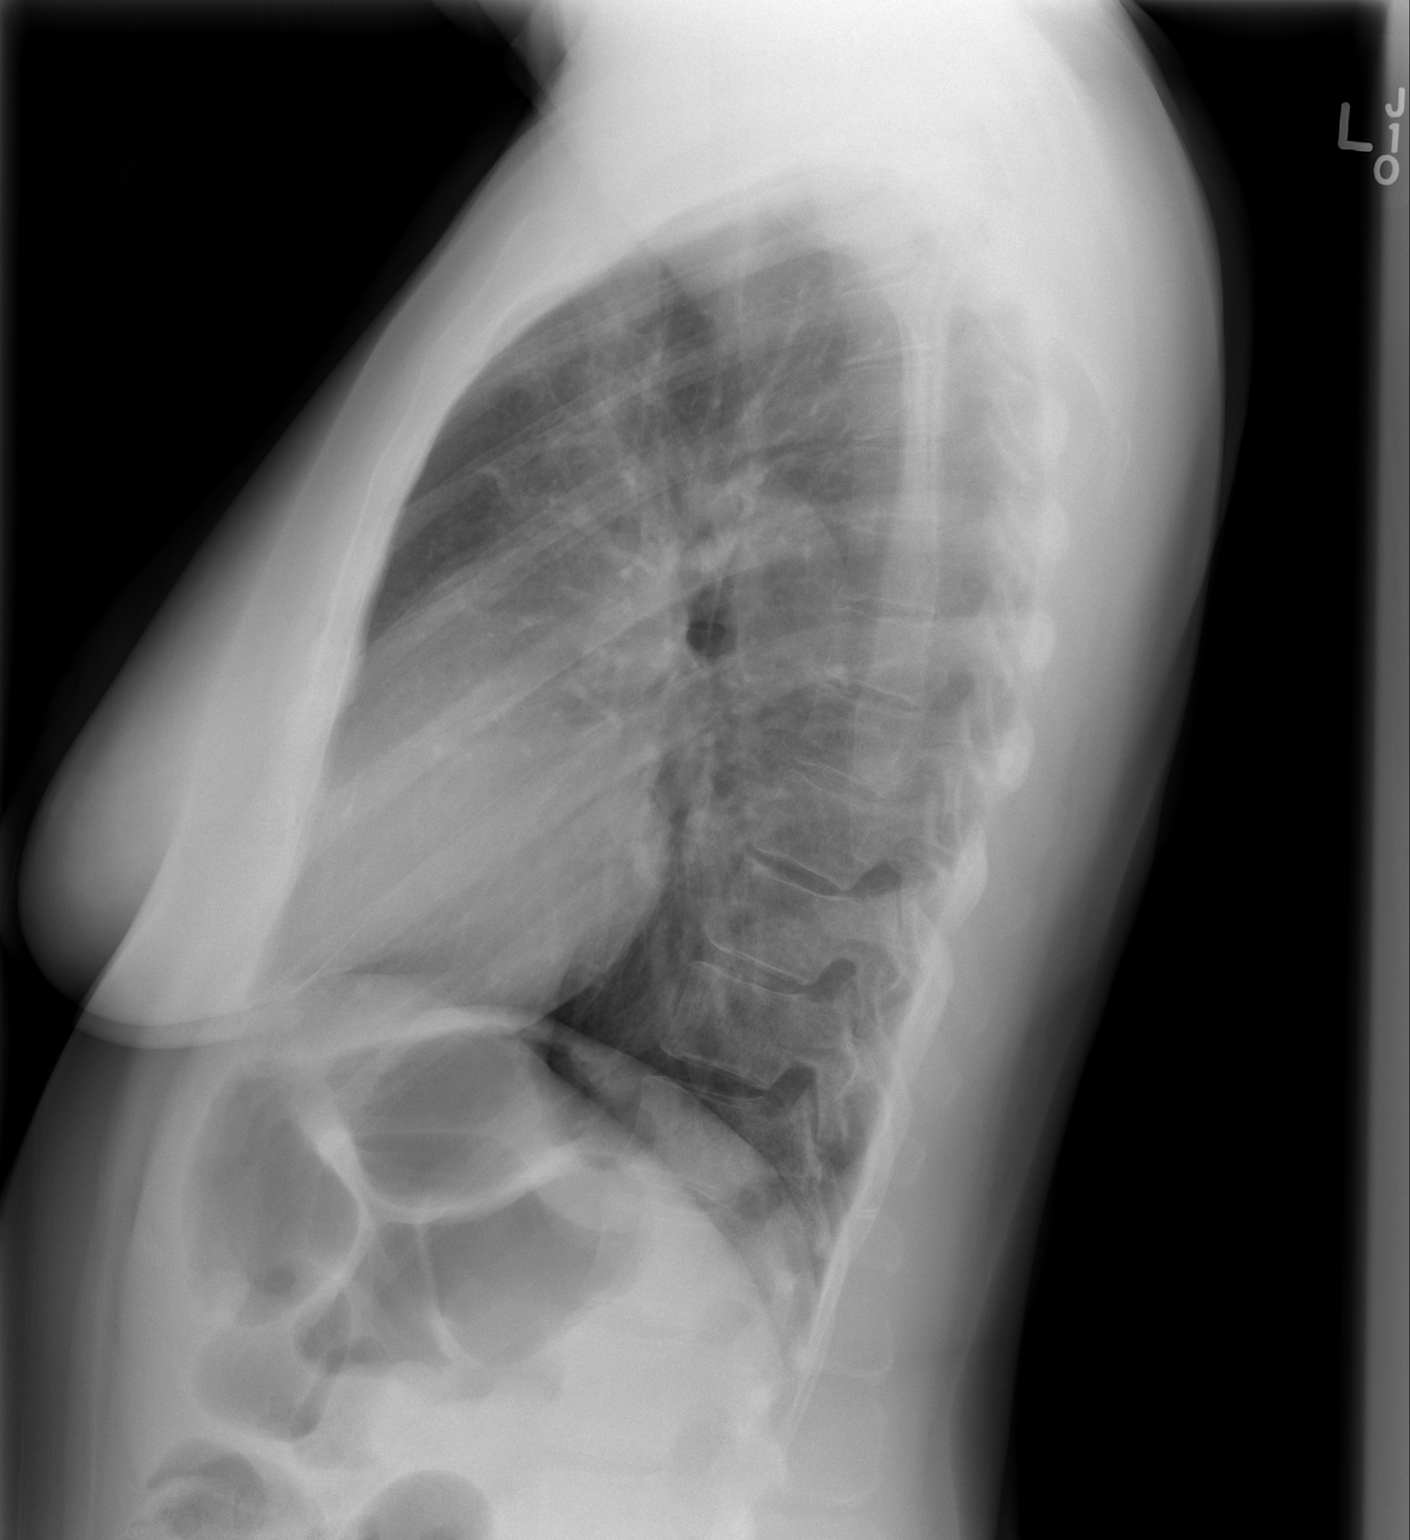

[2 of 2 positions shown; findings below may reference images not displayed]

FINDINGS: Enlargement of cardiac silhouette.

Mediastinal contours and pulmonary vascularity normal.

New poorly defined opacity in the RIGHT upper lobe could represent
infiltrate or tumor.

Area in question measures approximately 6.0 x 3.0 cm in size.

Remaining lungs clear.

No pleural effusion or pneumothorax.

Osseous structures unremarkable.
IMPRESSION: New poorly defined vague opacity in RIGHT upper lobe question
infiltrate versus tumor; CT chest with contrast recommended to
evaluate.

These results will be called to the ordering clinician or
representative by the Radiologist Assistant, and communication
documented in the PACS or zVision Dashboard.

## 2015-07-03 IMAGING — CT CT CHEST W/ CM
2 of 5 series · 15 of 36 positions shown, 19 images · IV contrast (APPLIED)
Comparison: CT 07/15/2014

CLINICAL DATA: Right breast carcinoma. Status post radiation
therapy.

EXAM:
CT CHEST WITH CONTRAST
TECHNIQUE: Multidetector CT imaging of the chest was performed during
intravenous contrast administration.
CONTRAST:  80mL OMNIPAQUE IOHEXOL 300 MG/ML  SOLN

[Series 2: thorax 5.0 i31f 1 · axial · 0.56mm/px · z∈[+1114,+1354]mm · 13 of 55 slices shown, 17 images]
[im 5/55  mediastinal]
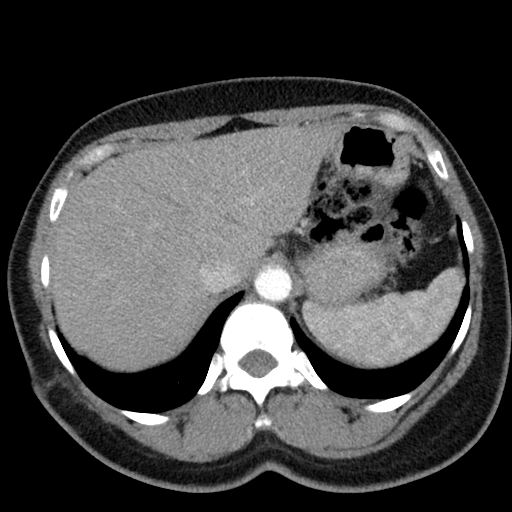
[im 5/55  lung]
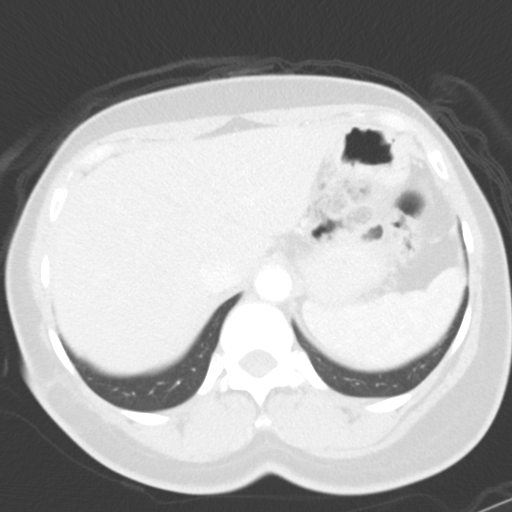
[im 9/55  lung]
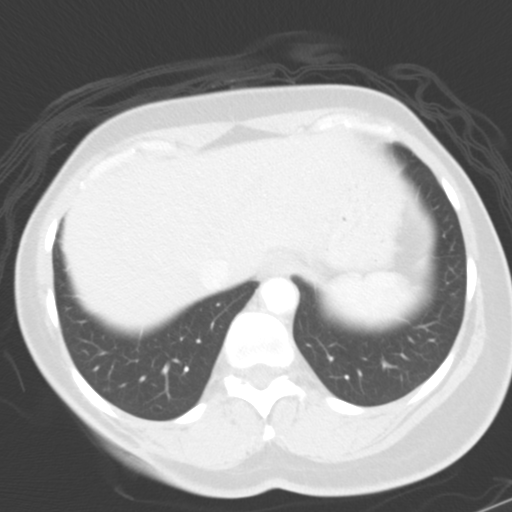
[im 13/55  lung]
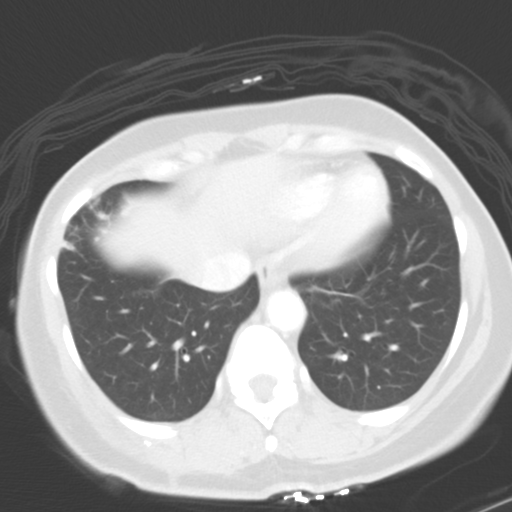
[im 17/55  lung]
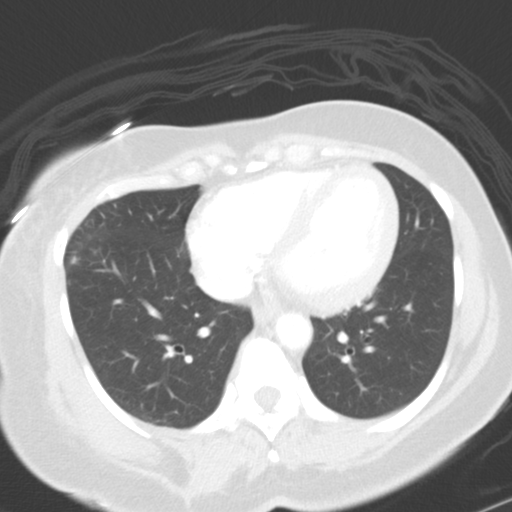
[im 21/55  mediastinal]
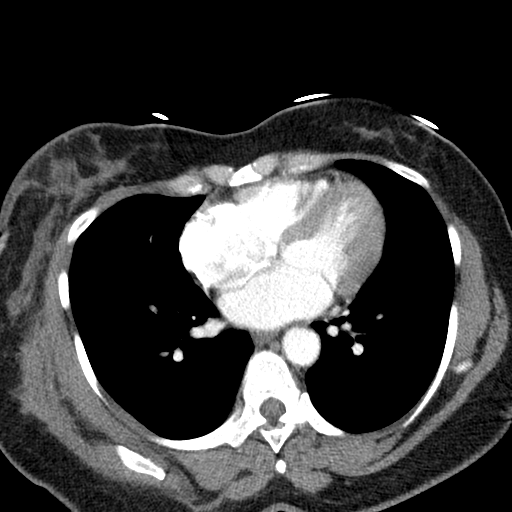
[im 21/55  lung]
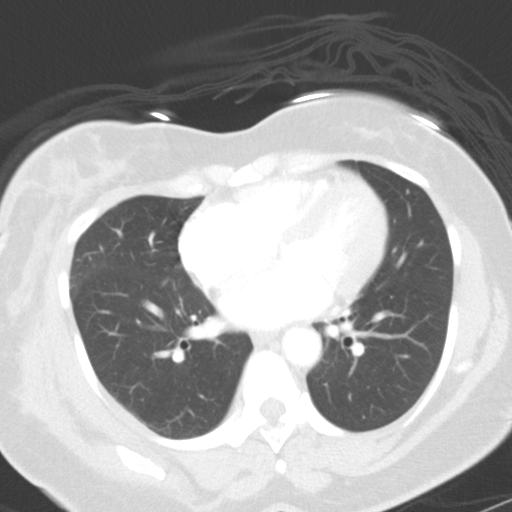
[im 25/55  lung]
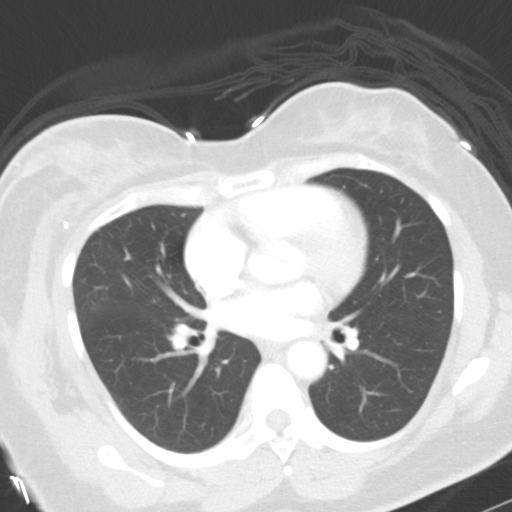
[im 29/55  lung]
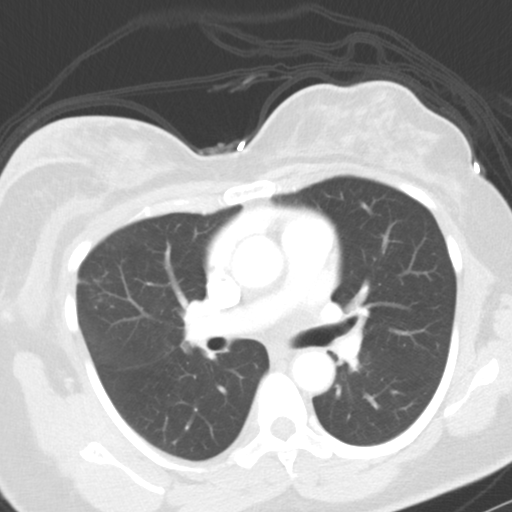
[im 33/55  lung]
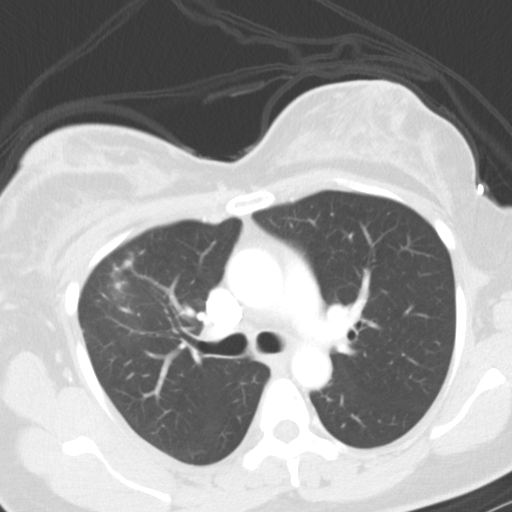
[im 37/55  mediastinal]
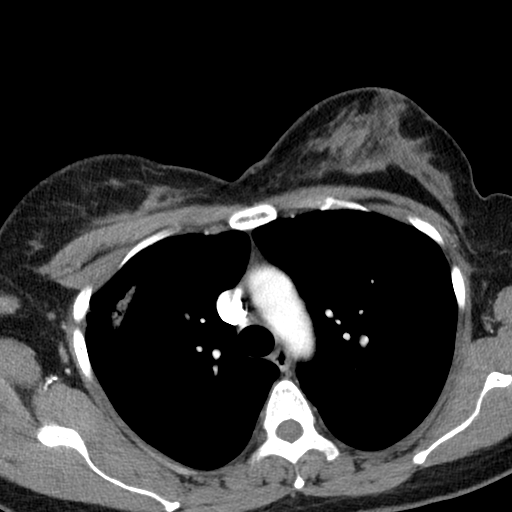
[im 37/55  lung]
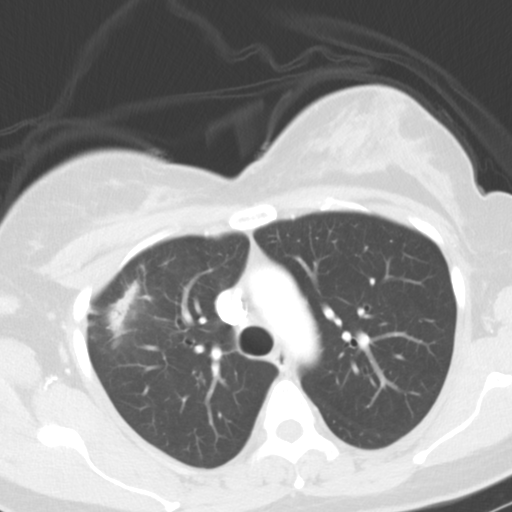
[im 41/55  lung]
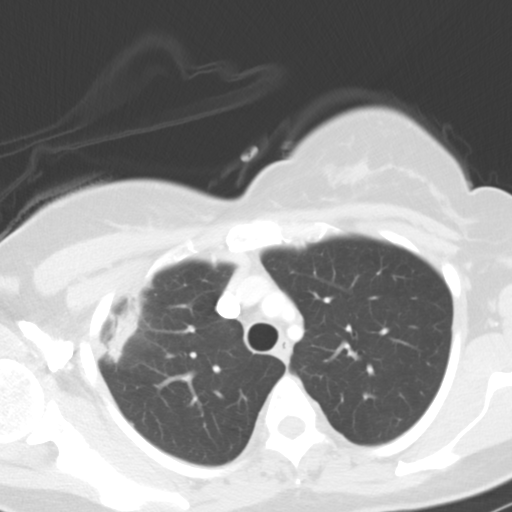
[im 45/55  lung]
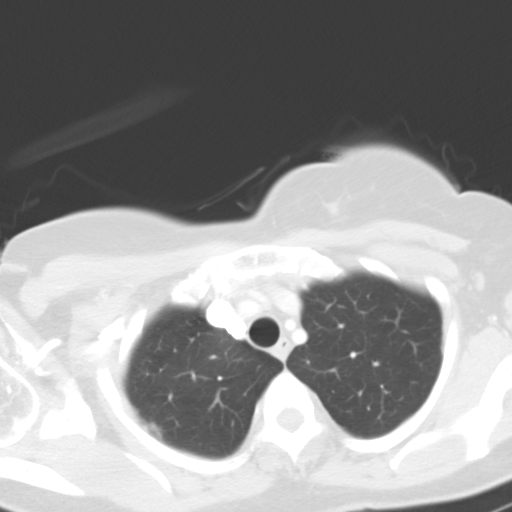
[im 49/55  lung]
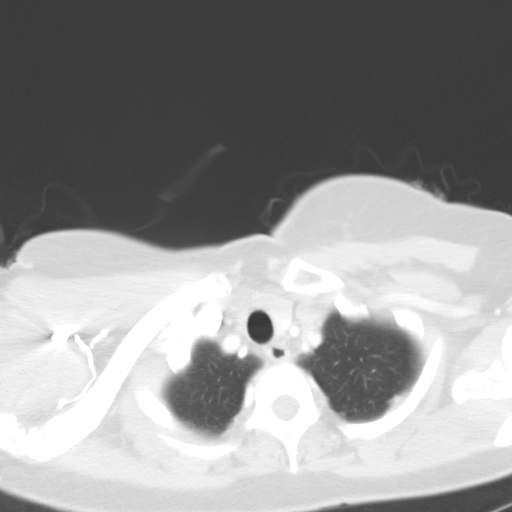
[im 53/55  mediastinal]
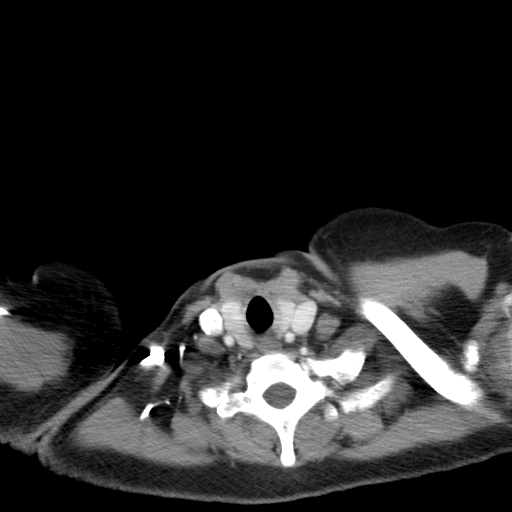
[im 53/55  lung]
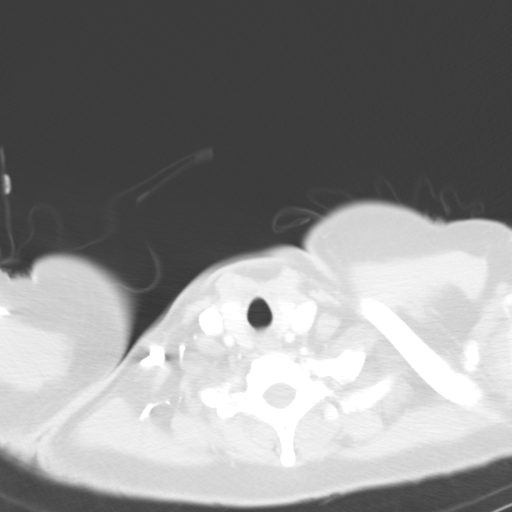

[Series 5: coronal · coronal · 0.57mm/px · 2 of 101 slices shown]
[im 34/101  lung]
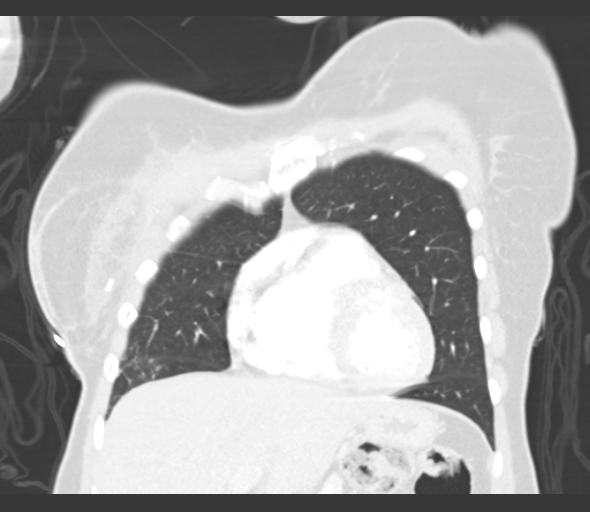
[im 67/101  lung]
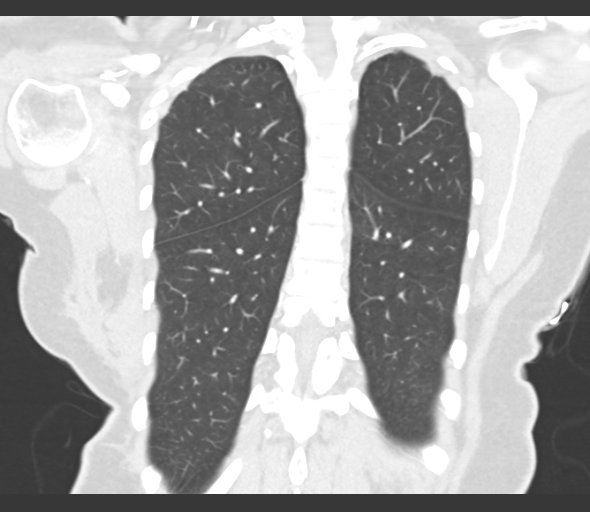

[15 of 36 positions shown; findings below may reference images not displayed]

FINDINGS: Mediastinum/Nodes: No axillary or supraclavicular lymphadenopathy.
No mediastinal or hilar lymphadenopathy. No pericardial fluid.
Esophagus is normal

Lungs/Pleura: There is a new linear band of consolidation in the
periphery of the right upper lobe consistent radiation change (image
17 from series 3). Mild reticular nodular pattern in the inferior
right middle lobe is also new (image 42, series 30>

Upper abdomen: Limited view of the liver, kidneys, pancreas are
unremarkable. Normal adrenal glands.

Musculoskeletal: Postsurgical change in the right chest wall
consistent lumpectomy and lymphadenectomy. No aggressive osseous
lesion.
IMPRESSION: 1. New band of consolidation in the right upper lobe consists with
radiation change.
2. Reticular nodular pattern in the inferior right middle lobe is
likely post infectious or inflammatory.
3. Postsurgical change in the right chest wall / breast.

## 2015-09-05 ENCOUNTER — Ambulatory Visit: Payer: BLUE CROSS/BLUE SHIELD | Admitting: Radiation Oncology

## 2015-09-13 ENCOUNTER — Ambulatory Visit
Admission: RE | Admit: 2015-09-13 | Discharge: 2015-09-13 | Disposition: A | Discharge status: Home / Self Care | Payer: BLUE CROSS/BLUE SHIELD | Source: Ambulatory Visit | Attending: Radiation Oncology | Admitting: Radiation Oncology

## 2015-09-13 ENCOUNTER — Encounter: Payer: Self-pay | Admitting: Radiation Oncology

## 2015-09-13 VITALS — BP 153/92 | HR 83 | Temp 97.3°F | Ht 67.0 in | Wt 156.2 lb

## 2015-09-13 DIAGNOSIS — C50911 Malignant neoplasm of unspecified site of right female breast: Secondary | ICD-10-CM

## 2015-09-13 NOTE — Progress Notes (Signed)
CC: Dr. Jackolyn Hinton  Follow-up note:  Stacey Hinton returns today approximately 10 months following completion of radiation therapy following conservative surgery in the management of her malignant phyllodes of the right breast.  She underwent oncoplastic surgery with Stacey Hinton at the time of her breast conservation surgery with Stacey Hinton.  She was seen by Stacey Hinton last month and he was was pleased with her cosmesis.  She tells that she will see Stacey Hinton again next week after seeing him this past May.  Mammography this past April was satisfactory.  She is without complaints today.  Physical examination: Alert and oriented. Filed Vitals:   09/13/15 1618 09/13/15 1637  BP: 175/92 153/92  Pulse: 88 83  Temp: 97.3 F (36.3 C)    Nodes: There is no palpable supraclavicular, or axillary lymphadenopathy.  Breasts: There is residual hyperpigmentation the skin along the right breast with mild thickening the right breast.  There is a slight ridge of induration along the lateral breast from her previous surgery as expected.  No discreet masses are appreciated.  Left breast without masses or lesions.  Extremities: Without edema.  Impression: Satisfactory progress.  Plan: She'll maintain her follow-up with Stacey Hinton.  She will see him next week.  She'll also see Dr. [name] for a follow-up visit in February 2017.  I've not scheduled Stacey Hinton for a follow-up, but we will more than happy see her in the future should the need arise.

## 2015-09-13 NOTE — Progress Notes (Signed)
Stacey Hinton here for reassessments/p XRT to her right breast which completed on 11/28/14.  Denies any pain at this time.  Skin on breast with mild pigmentation, soft and intact.

## 2015-09-29 ENCOUNTER — Other Ambulatory Visit: Payer: Self-pay | Admitting: *Deleted

## 2015-09-29 DIAGNOSIS — C50911 Malignant neoplasm of unspecified site of right female breast: Secondary | ICD-10-CM

## 2015-10-03 ENCOUNTER — Other Ambulatory Visit: Payer: BLUE CROSS/BLUE SHIELD

## 2015-10-04 ENCOUNTER — Telehealth: Payer: Self-pay | Admitting: Oncology

## 2015-10-04 NOTE — Telephone Encounter (Signed)
Patient called in to reschedule her labs  Stacey Hinton °

## 2015-10-05 ENCOUNTER — Other Ambulatory Visit (HOSPITAL_BASED_OUTPATIENT_CLINIC_OR_DEPARTMENT_OTHER): Payer: 59

## 2015-10-05 DIAGNOSIS — C50911 Malignant neoplasm of unspecified site of right female breast: Secondary | ICD-10-CM

## 2015-10-05 LAB — COMPREHENSIVE METABOLIC PANEL
ALBUMIN: 3.8 g/dL (ref 3.5–5.0)
ALK PHOS: 92 U/L (ref 40–150)
ALT: 18 U/L (ref 0–55)
ANION GAP: 8 meq/L (ref 3–11)
AST: 18 U/L (ref 5–34)
BILIRUBIN TOTAL: 0.34 mg/dL (ref 0.20–1.20)
BUN: 9.3 mg/dL (ref 7.0–26.0)
CALCIUM: 9.1 mg/dL (ref 8.4–10.4)
CO2: 25 mEq/L (ref 22–29)
CREATININE: 0.7 mg/dL (ref 0.6–1.1)
Chloride: 105 mEq/L (ref 98–109)
Glucose: 100 mg/dl (ref 70–140)
Potassium: 3.6 mEq/L (ref 3.5–5.1)
Sodium: 138 mEq/L (ref 136–145)
TOTAL PROTEIN: 7.8 g/dL (ref 6.4–8.3)

## 2015-10-05 LAB — CBC WITH DIFFERENTIAL/PLATELET
BASO%: 0.9 % (ref 0.0–2.0)
Basophils Absolute: 0 10*3/uL (ref 0.0–0.1)
EOS ABS: 0.1 10*3/uL (ref 0.0–0.5)
EOS%: 2.7 % (ref 0.0–7.0)
HEMATOCRIT: 36.1 % (ref 34.8–46.6)
HEMOGLOBIN: 11.9 g/dL (ref 11.6–15.9)
LYMPH#: 1.3 10*3/uL (ref 0.9–3.3)
LYMPH%: 28.8 % (ref 14.0–49.7)
MCH: 27.2 pg (ref 25.1–34.0)
MCHC: 33 g/dL (ref 31.5–36.0)
MCV: 82.4 fL (ref 79.5–101.0)
MONO#: 0.6 10*3/uL (ref 0.1–0.9)
MONO%: 12.6 % (ref 0.0–14.0)
NEUT%: 55 % (ref 38.4–76.8)
NEUTROS ABS: 2.4 10*3/uL (ref 1.5–6.5)
PLATELETS: 320 10*3/uL (ref 145–400)
RBC: 4.39 10*6/uL (ref 3.70–5.45)
RDW: 13.5 % (ref 11.2–14.5)
WBC: 4.5 10*3/uL (ref 3.9–10.3)

## 2015-10-10 ENCOUNTER — Ambulatory Visit (HOSPITAL_COMMUNITY)
Admission: RE | Admit: 2015-10-10 | Discharge: 2015-10-10 | Disposition: A | Discharge status: Home / Self Care | Payer: 59 | Source: Ambulatory Visit | Attending: Oncology | Admitting: Oncology

## 2015-10-10 ENCOUNTER — Other Ambulatory Visit: Payer: BLUE CROSS/BLUE SHIELD

## 2015-10-10 ENCOUNTER — Ambulatory Visit (HOSPITAL_BASED_OUTPATIENT_CLINIC_OR_DEPARTMENT_OTHER): Payer: 59 | Admitting: Oncology

## 2015-10-10 ENCOUNTER — Telehealth: Payer: Self-pay | Admitting: Oncology

## 2015-10-10 VITALS — BP 152/86 | HR 75 | Temp 97.6°F | Resp 18 | Ht 67.0 in | Wt 157.4 lb

## 2015-10-10 DIAGNOSIS — C50911 Malignant neoplasm of unspecified site of right female breast: Secondary | ICD-10-CM | POA: Diagnosis present

## 2015-10-10 NOTE — Progress Notes (Signed)
[place]  [phone] Fax:([phone]) [phone]     ID: SOYNA FRIER DOB: [birth year]  MR#: [id number]  [id number]  Patient Care Team: Stacey Jordan, MD as PCP - General (Family Medicine) Stacey Cruel, MD as Consulting Physician (Oncology) OTHER MD: Stacey Hinton, Stacey Hinton, Stacey Hinton  CHIEF COMPLAINT: Malignant phyllodes tumor  CURRENT TREATMENT: Observation   HISTORY OF PRESENT ILLNESS: From the original intake note:  The patient tells me Dr. Madaline Hinton noted a mass in her right breast during routine exam in the fall of 2012. He set her up for mammography and ultrasonography at the breast center, performed 06/14/2011. The palpable mass was in the outer right breast in compression views showed a partially circumscribed oval mass in that area. This was at the 9:00 position 6 cm from the nipple by physical exam. Ultrasound showed a circumscribed lobulated oval mass measuring 2.3 cm.  This was felt to be most likely a fibroadenoma and on 09/09/2011 this was confirmed by biopsy of the right breast mass, which showed ([id number]) a fibroadenoma with no evidence of malignancy.  The patient underwent a digital screening mammography 10/12/2012 and 10/14/2013 which showed no suspicious findings. The patient's breast density was noted to the category D. despite this, when Dr. table and examined the patient in February 2015 it seemed to him that the mass had grown some. The patient was referred to Dr. Lynwood Hinton for further evaluation and he confirmed that the patient's left breast was larger than the right and that there was a 7 cm right breast mass with tenting up of the skin, but without inflammatory changes. Biopsy of this mass was obtained 02/14/2014, and now it showed ([id number]) a biphasic stromal lesion favored to be a phyllodes tumor.  With this information and with the assistance of Dr. Harlow Hinton the patient underwent right partial mastectomy with  immediate latissimus flap and nipple repositioning on 07/04/2014. The pathology from that procedure ([id number]) showed a high-grade phyllodes tumor measuring 8.3 cm. There was expensive stromal overgrowth, significant cytologic atypia, increased mitotic activity and a focally infiltrative border. The tumor was focally 2 mm from the deep margin.   At the multidisciplinary breast cancer conference 07/13/2014 this case was discussed. The deep margin is down to muscle and therefore cannot be improved. Staging studies were suggested to rule out metastatic disease, and it was felt that radiation should be strongly considered. Follow-up with MRI was also discussed, since the patient's mammograms had been essentially noninformative.  The patient's subsequent history is as detailed below  INTERVAL HISTORY: Stacey Hinton today for follow-up of her phyllodes tumor. She feels well and she intends to stay healthy, she tells me. For exercise she only walks maybe about 10 minutes may be about 3 days a week. She tells me she has a good diet and likes-troublesome fruits. As far as her breasts are concerned there has been no change.   REVIEW OF SYSTEMS: Aside from minus seasonal allergies a detailed review of systems today was noncontributory  PAST MEDICAL HISTORY: Past Medical History  Diagnosis Date  . Hypertension   . 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 Breast cancer ([place])   . 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 S/P radiation therapy 10/18/2014 through 02/29/201601/19/2016 through 11/28/2014      Right breast 5040 cGy in 28 sessions     PAST SURGICAL HISTORY: Past Surgical History  Procedure Laterality Date  . Cesarean section  06/16/98, 03/06/06  . Mastectomy, partial Right 07/04/2014    WITH RECONSTRUCTION    DR Stacey Hinton  . Latissimus flap to  breast Right 07/04/2014    Procedure:  LATISSIMUS FLAP FOR RIGHT BREAST RECONSTRUCTION ;  Surgeon: Stacey Reese, MD;  Location: [facility];  Service: Plastics;  Laterality: Right;  . Mastectomy, partial Right 07/04/2014    Procedure: RIGHT MASTECTOMY PARTIAL;  Surgeon: Stacey Confer, MD;  Location: [facility];  Service: General;  Laterality: Right;    FAMILY HISTORY Family History  Problem Relation Age of Onset  . Cancer Maternal Aunt 67    breast  . Hypertension Mother    the patient's parents are still living. She is an only child. There is no history of cancer in the family to her knowledge  GYNECOLOGIC HISTORY:  No LMP recorded. Menarche age 72, first live birth age 28, the patient is [place] P2. She is still menstruating regularly. 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 Her husband Stacey Hinton is in the service department of a local company. Daughter Stacey Hinton, 27, and son  Stacey Hinton, 82, are at home.      ADVANCED DIRECTIVES: Not in place    HEALTH MAINTENANCE: Social History  Substance Use Topics  . Smoking status: Never Smoker   . Smokeless tobacco: Never Used  . Alcohol Use: Yes     Comment: occ     Colonoscopy: SEPT 2016  PAP:  Bone density:  Lipid panel:  No Known Allergies  Current Outpatient Prescriptions  Medication Sig Dispense Refill  . amLODipine (NORVASC) 5 MG tablet Take 5 mg by mouth daily.    Marland Kitchen losartan (COZAAR) 50 MG tablet Take 50 mg by mouth daily.     No current facility-administered medications for this visit.    OBJECTIVE: middle-aged African-American woman in no acute distress Filed Vitals:   10/10/15 1209  BP: 152/86  Pulse: 75  Temp: 97.6 F (36.4 C)  Resp: 18     Body mass index is 24.65 kg/(m^2).    ECOG FS:0 - Asymptomatic  Sclerae unicteric, EOMs intact Oropharynx clear, dentition in good repair No cervical or supraclavicular adenopathy Lungs no rales or  rhonchi Heart regular rate and rhythm Abd soft, nontender, positive bowel sounds MSK no focal spinal tenderness, no upper extremity lymphedema Neuro: nonfocal, well oriented, appropriate affect Breasts: the right breast is status post lumpectomy and radiation.the hyperpigmentation is resolving. The overall cosmetic result is excellent. There is no evidence of persistent or recurrent disease. The right axilla is benign. The left breast is unremarkable.  LAB RESULTS:  CMP     Component Value Date/Time   NA 138 10/05/2015 1323   NA 138 06/24/2014 1358   K 3.6 10/05/2015 1323   K 3.6* 06/24/2014 1358   CL 101 06/24/2014 1358   CO2 25 10/05/2015 1323   CO2 26 06/24/2014 1358   GLUCOSE 100 10/05/2015 1323   GLUCOSE 88 06/24/2014 1358   BUN 9.3 10/05/2015 1323   BUN 6 06/24/2014 1358   CREATININE 0.7 10/05/2015 1323   CREATININE 0.54 06/24/2014 1358   CALCIUM 9.1 10/05/2015 1323   CALCIUM 8.8 06/24/2014 1358   PROT 7.8 10/05/2015 1323   PROT 7.7 06/24/2014 1358   ALBUMIN 3.8 10/05/2015 1323   ALBUMIN 3.9 06/24/2014 1358   AST 18 10/05/2015 1323   AST 16 06/24/2014 1358   ALT 18 10/05/2015 1323   ALT  11 06/24/2014 1358   ALKPHOS 92 10/05/2015 1323   ALKPHOS 82 06/24/2014 1358   BILITOT 0.34 10/05/2015 1323   BILITOT 0.2* 06/24/2014 1358   GFRNONAA >90 06/24/2014 1358   GFRAA >90 06/24/2014 1358    I No results found for: SPEP  Lab Results  Component Value Date   WBC 4.5 10/05/2015   NEUTROABS 2.4 10/05/2015   HGB 11.9 10/05/2015   HCT 36.1 10/05/2015   MCV 82.4 10/05/2015   PLT 320 10/05/2015      Chemistry      Component Value Date/Time   NA 138 10/05/2015 1323   NA 138 06/24/2014 1358   K 3.6 10/05/2015 1323   K 3.6* 06/24/2014 1358   CL 101 06/24/2014 1358   CO2 25 10/05/2015 1323   CO2 26 06/24/2014 1358   BUN 9.3 10/05/2015 1323   BUN 6 06/24/2014 1358   CREATININE 0.7 10/05/2015 1323   CREATININE 0.54 06/24/2014 1358      Component Value Date/Time     CALCIUM 9.1 10/05/2015 1323   CALCIUM 8.8 06/24/2014 1358   ALKPHOS 92 10/05/2015 1323   ALKPHOS 82 06/24/2014 1358   AST 18 10/05/2015 1323   AST 16 06/24/2014 1358   ALT 18 10/05/2015 1323   ALT 11 06/24/2014 1358   BILITOT 0.34 10/05/2015 1323   BILITOT 0.2* 06/24/2014 1358       No results found for: LABCA2  No components found for: LABCA125  No results for input(s): INR in the last 168 hours.  Urinalysis No results found for: COLORURINE  STUDIES: No results found.  ASSESSMENT: 50 y.o. [place] woman   (1) status post biopsy of a right upper quadrant breast mass 09/09/2011 showing fibroadenoma.  (2) biopsy of an enlarging mass in the right breast upper outer quadrant 02/14/2014 showed a biphasic stromal/epithelial lesion.  (3) status post right lumpectomy 07/04/2014 for an 8.3 cm malignant phyllodes tumor with extensive stromal overgrowth, significant cytologic atypia, significant mitotic activity, and a focally infiltrative border. The closest margin (posterior) is 2 mm.  (a) she had an immediate latissimus flap reconstruction  (4) Adjuvant radiation completed to 20 06/20/2015: Right breast 5040 cGy in 28 sessions  (5) category D breast density   PLAN: [place] not a little over a year out from her definitive surgery for her phyllodes tumor. She was supposed to have had a chest x-ray prior to today but for some reason that did not happen so we will get one today on the way out.  I have scheduled her for mammography with tomography at the breast Center in April. If there is any question regarding those results we will proceed to an MRI.  She will follow-up with Dr.Rosenbower in May and Dr. Cheron Schaumann in June. Accordingly she will return to see me in November.at that time we will consider whether to repeat a CT scan before the end of the year  She has a good extending of the overall plan. She agrees with it. She will call with any problems that may develop  before her next visit here.  Stacey Cruel, MD   10/10/2015 12:29 PM

## 2015-10-10 NOTE — Telephone Encounter (Signed)
Called and left a message with mammo appointment as well as her 07/2016 follow up

## 2015-10-17 ENCOUNTER — Other Ambulatory Visit: Payer: BLUE CROSS/BLUE SHIELD

## 2015-10-24 ENCOUNTER — Ambulatory Visit: Payer: BLUE CROSS/BLUE SHIELD | Admitting: Oncology

## 2016-01-26 ENCOUNTER — Inpatient Hospital Stay: Admission: RE | Admit: 2016-01-26 | Payer: 59 | Source: Ambulatory Visit

## 2016-01-29 HISTORY — PX: BREAST EXCISIONAL BIOPSY: SUR124

## 2016-02-23 ENCOUNTER — Ambulatory Visit
Admission: RE | Admit: 2016-02-23 | Discharge: 2016-02-23 | Disposition: A | Discharge status: Home / Self Care | Payer: 59 | Source: Ambulatory Visit | Attending: Oncology | Admitting: Oncology

## 2016-02-23 ENCOUNTER — Other Ambulatory Visit: Payer: Self-pay | Admitting: Oncology

## 2016-02-23 DIAGNOSIS — N6320 Unspecified lump in the left breast, unspecified quadrant: Secondary | ICD-10-CM

## 2016-02-23 DIAGNOSIS — C50911 Malignant neoplasm of unspecified site of right female breast: Secondary | ICD-10-CM

## 2016-02-27 ENCOUNTER — Other Ambulatory Visit: Payer: Self-pay | Admitting: Oncology

## 2016-02-27 ENCOUNTER — Ambulatory Visit
Admission: RE | Admit: 2016-02-27 | Discharge: 2016-02-27 | Disposition: A | Discharge status: Home / Self Care | Payer: 59 | Source: Ambulatory Visit | Attending: Oncology | Admitting: Oncology

## 2016-02-27 DIAGNOSIS — C50911 Malignant neoplasm of unspecified site of right female breast: Secondary | ICD-10-CM

## 2016-02-27 DIAGNOSIS — N6320 Unspecified lump in the left breast, unspecified quadrant: Secondary | ICD-10-CM

## 2016-03-11 ENCOUNTER — Ambulatory Visit: Payer: Self-pay | Admitting: General Surgery

## 2016-03-11 DIAGNOSIS — R921 Mammographic calcification found on diagnostic imaging of breast: Secondary | ICD-10-CM

## 2016-03-11 NOTE — H&P (Signed)
Stacey Hinton 03/11/2016 4:06 PM Location: [facility] Patient #: [id number] DOB: [birth year] Married / Language: English / Race: Black or African American Female  History of Present Illness [name] MD; 03/11/2016 6:28 PM) Patient words: left breast mass.  The patient is a 50 year old female.   Note:She is referred by Dr. Joneen Caraway because of a mucocele like lesion upper inner quadrant of left breast. This was discovered on her 3-D mammogram. A 6 mm lesion was noted in the upper outer quadrant with some new calcifications. Stereotactic biopsy was performed. Pathology demonstrated a mucocele like lesion and fibrocystic changes. Wider excision was recommended and she presents to discuss that. Her past medical history is notable for a malignant phyllodes tumor of the right breast as below. She states she has a fair amount of swelling at the biopsy site.  Right partial mastectomy and immediate reconstruction; postop XRT 07/04/14 for Malignant phyllodes tumor.  Problem List/Past Medical Ventura Sellers, [place]; 03/11/2016 4:07 PM) MALIGNANT PHYLLODES TUMOR OF BREAST, RIGHT (C50.911)  Other Problems Ventura Sellers, [place]; 03/11/2016 4:07 PM) Breast Cancer Unspecified Diagnosis  Past Surgical History Ventura Sellers, [place]; 03/11/2016 4:07 PM) Breast Biopsy Right. Breast Mass; Local Excision Right. Breast Reconstruction Right. Cesarean Section - Multiple  Diagnostic Studies History Ventura Sellers, [place]; 03/11/2016 4:07 PM) Colonoscopy never Mammogram within last year Pap Smear 1-5 years ago  Allergies Ventura Sellers, CMA; 03/11/2016 4:07 PM) No Known Drug Allergies 07/20/2014  Medication History Ventura Sellers, [place]; 03/11/2016 4:07 PM) AmLODIPine Besylate (5MG  Tablet, Oral) Active. Losartan Potassium (50MG  Tablet, Oral) Active. Medications Reconciled  Social History Ventura Sellers, [place]; 03/11/2016 4:07 PM) Alcohol use Occasional  alcohol use. Caffeine use Carbonated beverages, Coffee, Tea. No drug use Tobacco use Never smoker.  Family History Ventura Sellers, [place]; 03/11/2016 4:07 PM) Hypertension Father, Mother.  Pregnancy / Birth History Ventura Sellers, [place]; 03/11/2016 4:08 PM) Age at menarche 32 years. Contraceptive History Oral contraceptives. Gravida 2 Maternal age 58-35 Para 2 Regular periods    Vitals Sharyn Lull R. Brooks CMA; 03/11/2016 4:07 PM) 03/11/2016 4:07 PM Weight: 156.13 lb Height: 67in Body Surface Area: 1.82 m Body Mass Index: 24.45 kg/m  BP: 124/82 (Sitting, Left Arm, Standard)  Physical Exam [name] MD; 03/11/2016 6:30 PM)  The physical exam findings are as follows: Note:General-she looks well and is in no acute distress  Right breast-radiation changes to skin, lateral scar, no palpable masses.  Left breast-soft, 5 cm firm, ecchymotic mass superior aspect.  Lymph nodes-no cervical, supraclavicular, or axillary adenopathy.  Assessment & Plan [name] MD; 03/11/2016 6:30 PM)  BREAST MASS IN FEMALE (N63) Impression: She has a left breast mucocele-like lesion with fibrocystic change. Some of these lesions have been upgraded to Dr. carcinoma in situ after wider excision and thus that is recommended. We discussed that and she is in agreement. We will need to wait about 3-4 weeks to allow the hematoma to resolve.  Plan: Left breast lumpectomy after radioactive seed localization. I have explained the procedure, risks, and aftercare to her. Risks include but are not limited to bleeding, infection, wound problems, seroma formation, cosmetic deformity, anesthesia, need for further procedures. She seems to [facility] and agrees with the plan.  Jackolyn Confer, MD

## 2016-04-08 IMAGING — CR DG CHEST 2V
2 series · 2 of 2 positions shown · non-contrast
Comparison: None.

CLINICAL DATA: Malignant tumor right breast.

EXAM:
CHEST  2 VIEW

[w chest pa]
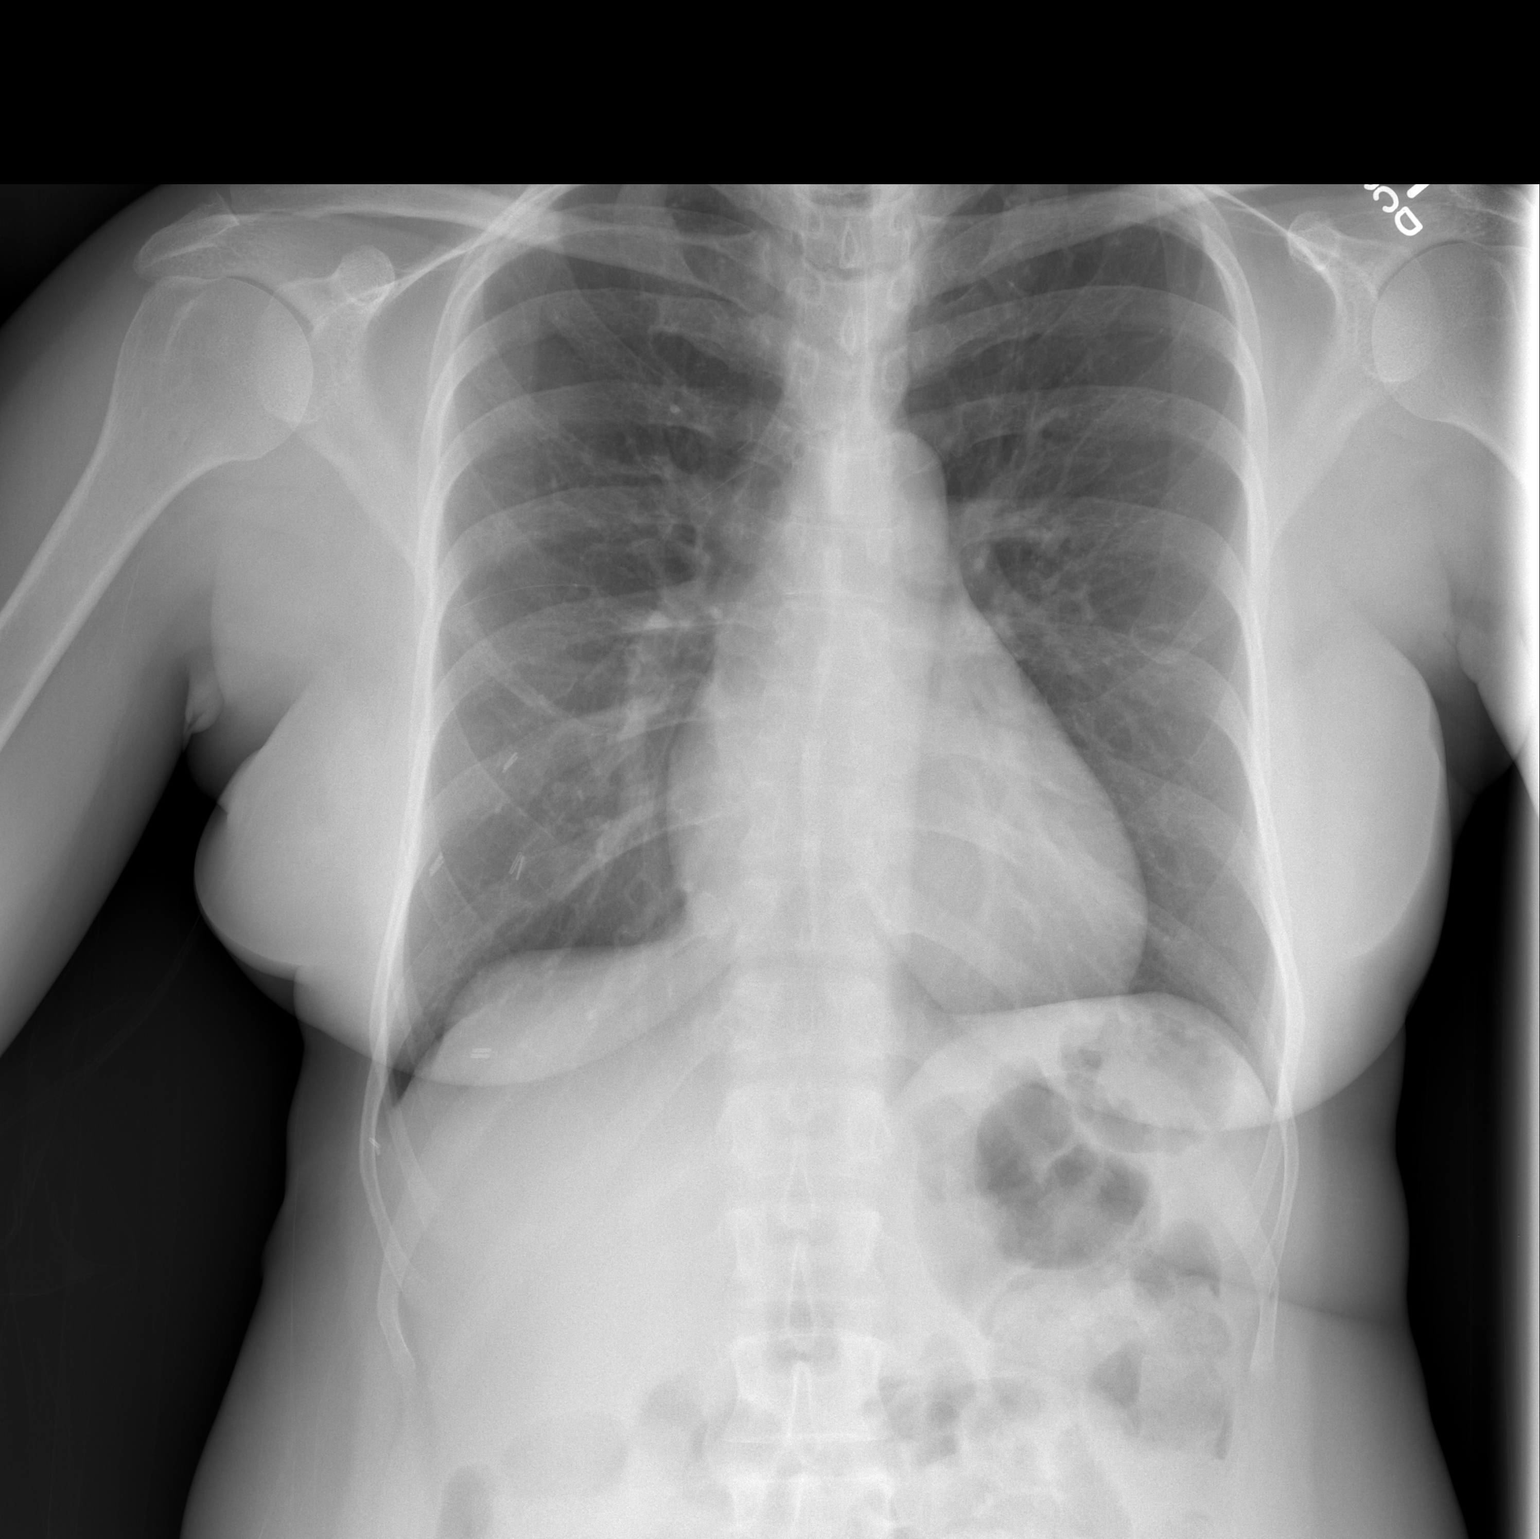

[w chest lat]
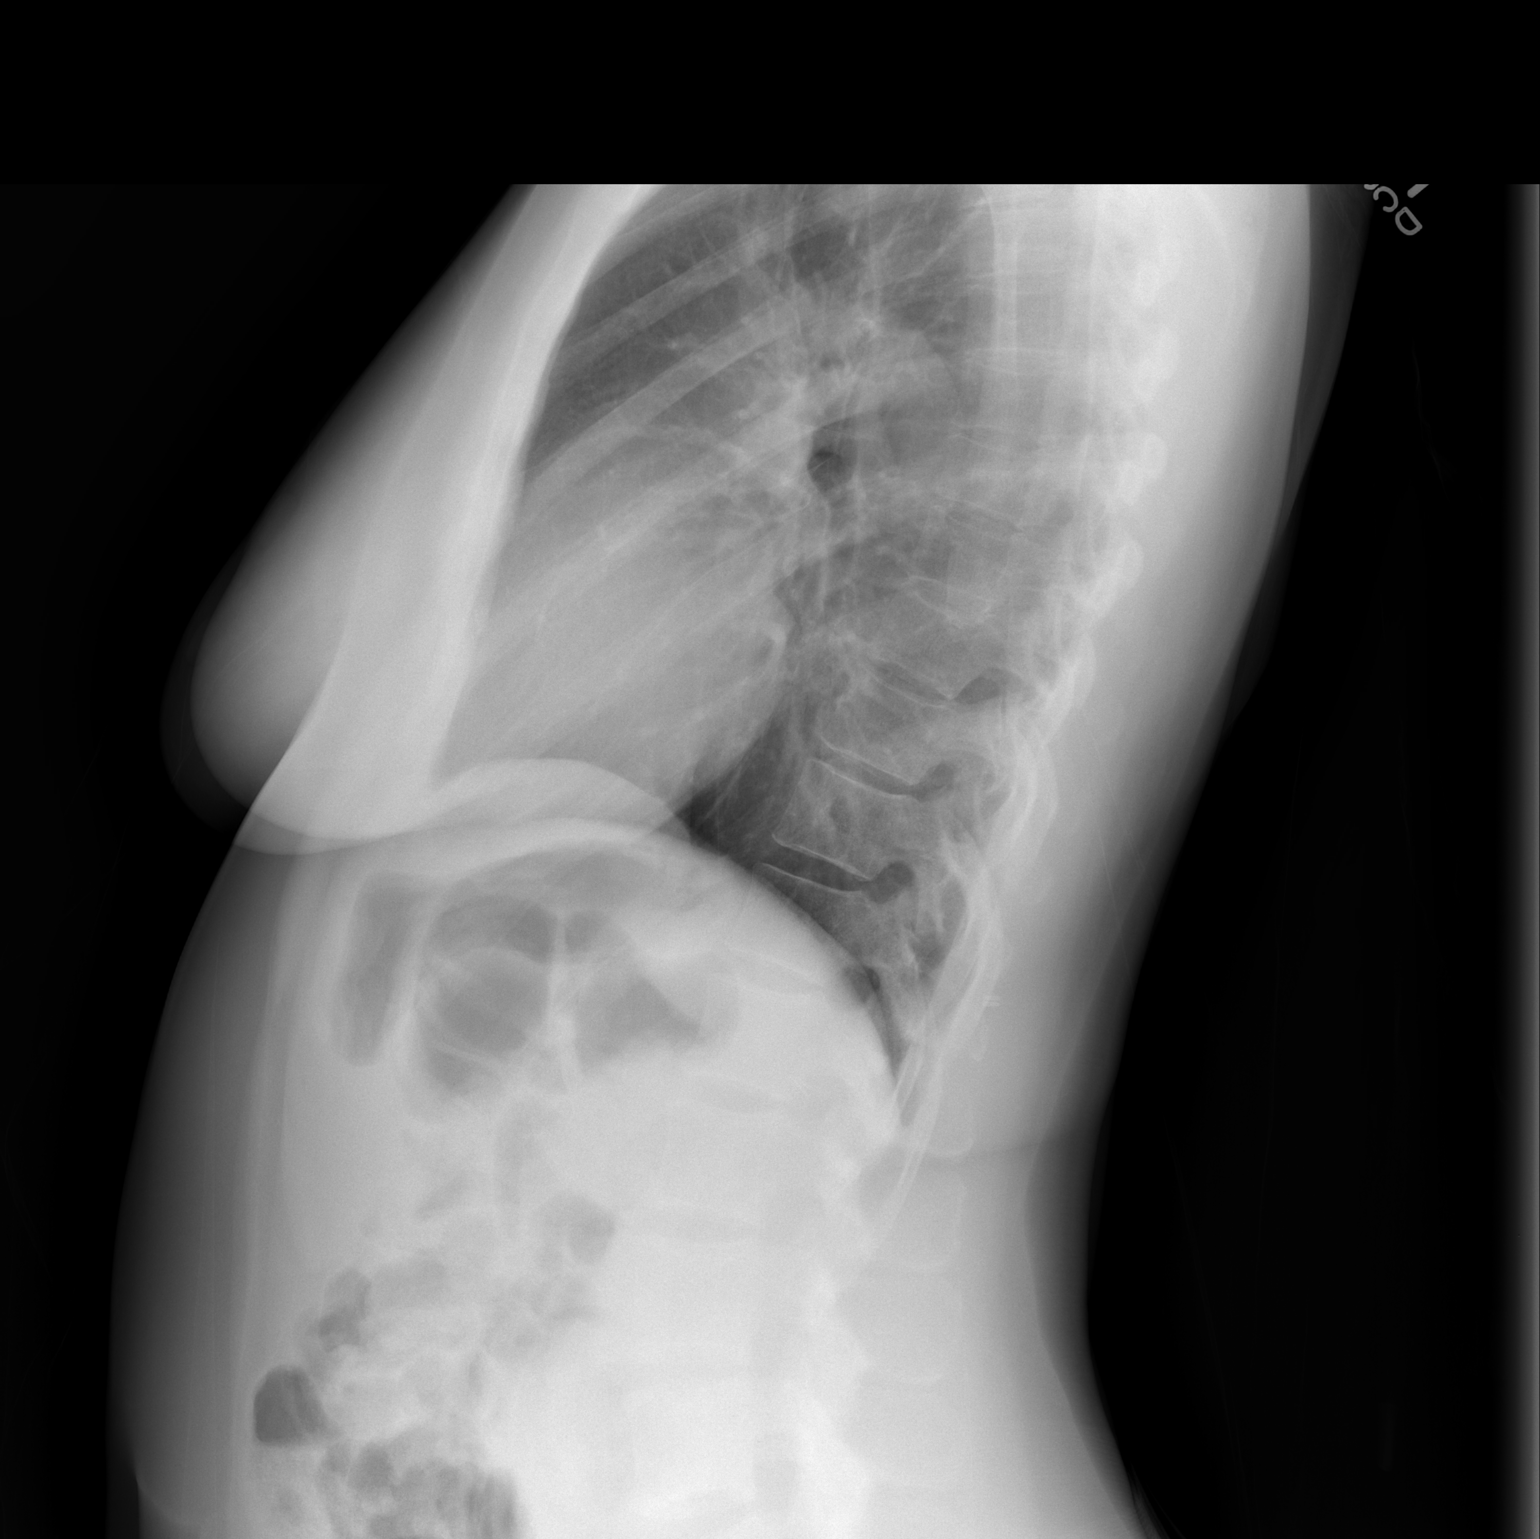

[2 of 2 positions shown; findings below may reference images not displayed]

FINDINGS: Mediastinum and hilar structures normal. Lungs are clear. Heart size
normal. No pleural effusion or pneumothorax. Surgical clips right
chest. No acute bony abnormality.
IMPRESSION: No acute cardiopulmonary disease.  Surgical clips right breast.

## 2016-04-09 ENCOUNTER — Other Ambulatory Visit: Payer: Self-pay | Admitting: General Surgery

## 2016-04-09 DIAGNOSIS — R921 Mammographic calcification found on diagnostic imaging of breast: Secondary | ICD-10-CM

## 2016-04-11 ENCOUNTER — Encounter (HOSPITAL_BASED_OUTPATIENT_CLINIC_OR_DEPARTMENT_OTHER): Payer: Self-pay | Admitting: *Deleted

## 2016-04-11 NOTE — Progress Notes (Signed)
Bring all medications. Plans to come Monday, July 24 th for BMET,CBC,Diff nd EKG.

## 2016-04-23 ENCOUNTER — Encounter (HOSPITAL_BASED_OUTPATIENT_CLINIC_OR_DEPARTMENT_OTHER)
Admission: RE | Admit: 2016-04-23 | Discharge: 2016-04-23 | Disposition: A | Discharge status: Home / Self Care | Payer: 59 | Source: Ambulatory Visit | Attending: General Surgery | Admitting: General Surgery

## 2016-04-23 DIAGNOSIS — N63 Unspecified lump in breast: Secondary | ICD-10-CM | POA: Diagnosis present

## 2016-04-23 LAB — BASIC METABOLIC PANEL
ANION GAP: 6 (ref 5–15)
BUN: 8 mg/dL (ref 6–20)
CALCIUM: 9.1 mg/dL (ref 8.9–10.3)
CO2: 26 mmol/L (ref 22–32)
Chloride: 106 mmol/L (ref 101–111)
Creatinine, Ser: 0.71 mg/dL (ref 0.44–1.00)
Glucose, Bld: 98 mg/dL (ref 65–99)
Potassium: 4 mmol/L (ref 3.5–5.1)
Sodium: 138 mmol/L (ref 135–145)

## 2016-04-23 NOTE — Pre-Procedure Instructions (Signed)
PT in for PAT appt, Boost breeze given, instructions reviewed.

## 2016-04-25 ENCOUNTER — Ambulatory Visit
Admission: RE | Admit: 2016-04-25 | Discharge: 2016-04-25 | Disposition: A | Discharge status: Home / Self Care | Payer: 59 | Source: Ambulatory Visit | Attending: General Surgery | Admitting: General Surgery

## 2016-04-25 DIAGNOSIS — R921 Mammographic calcification found on diagnostic imaging of breast: Secondary | ICD-10-CM

## 2016-04-26 ENCOUNTER — Encounter (HOSPITAL_BASED_OUTPATIENT_CLINIC_OR_DEPARTMENT_OTHER)
Admission: RE | Disposition: A | Discharge status: Home / Self Care | Payer: Self-pay | Source: Ambulatory Visit | Attending: General Surgery

## 2016-04-26 ENCOUNTER — Encounter (HOSPITAL_BASED_OUTPATIENT_CLINIC_OR_DEPARTMENT_OTHER): Payer: Self-pay | Admitting: *Deleted

## 2016-04-26 ENCOUNTER — Ambulatory Visit
Admission: RE | Admit: 2016-04-26 | Discharge: 2016-04-26 | Disposition: A | Discharge status: Home / Self Care | Payer: 59 | Source: Ambulatory Visit | Attending: General Surgery | Admitting: General Surgery

## 2016-04-26 ENCOUNTER — Ambulatory Visit (HOSPITAL_BASED_OUTPATIENT_CLINIC_OR_DEPARTMENT_OTHER): Payer: 59 | Admitting: Anesthesiology

## 2016-04-26 ENCOUNTER — Ambulatory Visit (HOSPITAL_BASED_OUTPATIENT_CLINIC_OR_DEPARTMENT_OTHER)
Admission: RE | Admit: 2016-04-26 | Discharge: 2016-04-26 | Disposition: A | Discharge status: Home / Self Care | Payer: 59 | Source: Ambulatory Visit | Attending: General Surgery | Admitting: General Surgery

## 2016-04-26 DIAGNOSIS — N63 Unspecified lump in breast: Secondary | ICD-10-CM | POA: Insufficient documentation

## 2016-04-26 DIAGNOSIS — N6320 Unspecified lump in the left breast, unspecified quadrant: Secondary | ICD-10-CM

## 2016-04-26 DIAGNOSIS — R921 Mammographic calcification found on diagnostic imaging of breast: Secondary | ICD-10-CM

## 2016-04-26 HISTORY — PX: BREAST LUMPECTOMY WITH RADIOACTIVE SEED LOCALIZATION: SHX6424

## 2016-04-26 LAB — POCT HEMOGLOBIN-HEMACUE: HEMOGLOBIN: 12.5 g/dL (ref 12.0–15.0)

## 2016-04-26 SURGERY — BREAST LUMPECTOMY WITH RADIOACTIVE SEED LOCALIZATION
Anesthesia: General | Site: Breast | Laterality: Left

## 2016-04-26 MED ORDER — SODIUM BICARBONATE 4 % IV SOLN
INTRAVENOUS | Status: AC
Start: 2016-04-26 — End: 2016-04-26
  Filled 2016-04-26: qty 5

## 2016-04-26 MED ORDER — GABAPENTIN 300 MG PO CAPS
300.0000 mg | ORAL_CAPSULE | ORAL | Status: AC
  Administered 2016-04-26: 300 mg via ORAL

## 2016-04-26 MED ORDER — LACTATED RINGERS IV SOLN
INTRAVENOUS | Status: DC
  Administered 2016-04-26 (×2): via INTRAVENOUS

## 2016-04-26 MED ORDER — CHLORHEXIDINE GLUCONATE CLOTH 2 % EX PADS: 6.0000 | MEDICATED_PAD | Freq: Once | CUTANEOUS | Status: DC

## 2016-04-26 MED ORDER — LIDOCAINE 2% (20 MG/ML) 5 ML SYRINGE
INTRAMUSCULAR | Status: DC | PRN
  Administered 2016-04-26: 60 mg via INTRAVENOUS

## 2016-04-26 MED ORDER — DEXAMETHASONE SODIUM PHOSPHATE 10 MG/ML IJ SOLN
INTRAMUSCULAR | Status: AC
  Filled 2016-04-26: qty 1

## 2016-04-26 MED ORDER — GLYCOPYRROLATE 0.2 MG/ML IJ SOLN: 0.2000 mg | Freq: Once | INTRAMUSCULAR | Status: DC | PRN

## 2016-04-26 MED ORDER — CELECOXIB 200 MG PO CAPS
ORAL_CAPSULE | ORAL | Status: AC
  Filled 2016-04-26: qty 2

## 2016-04-26 MED ORDER — FENTANYL CITRATE (PF) 100 MCG/2ML IJ SOLN: 25.0000 ug | INTRAMUSCULAR | Status: DC | PRN

## 2016-04-26 MED ORDER — FENTANYL CITRATE (PF) 100 MCG/2ML IJ SOLN
INTRAMUSCULAR | Status: AC
  Filled 2016-04-26: qty 2

## 2016-04-26 MED ORDER — CEFAZOLIN SODIUM-DEXTROSE 2-4 GM/100ML-% IV SOLN
2.0000 g | INTRAVENOUS | Status: AC
  Administered 2016-04-26: 2 g via INTRAVENOUS

## 2016-04-26 MED ORDER — SODIUM CHLORIDE 0.9% FLUSH: 3.0000 mL | INTRAVENOUS | Status: DC | PRN

## 2016-04-26 MED ORDER — CEFAZOLIN SODIUM-DEXTROSE 2-4 GM/100ML-% IV SOLN
INTRAVENOUS | Status: AC
  Filled 2016-04-26: qty 100

## 2016-04-26 MED ORDER — LIDOCAINE 2% (20 MG/ML) 5 ML SYRINGE
INTRAMUSCULAR | Status: AC
  Filled 2016-04-26: qty 5

## 2016-04-26 MED ORDER — PROPOFOL 10 MG/ML IV BOLUS
INTRAVENOUS | Status: AC
  Filled 2016-04-26: qty 20

## 2016-04-26 MED ORDER — ONDANSETRON HCL 4 MG/2ML IJ SOLN
INTRAMUSCULAR | Status: AC
  Filled 2016-04-26: qty 2

## 2016-04-26 MED ORDER — BUPIVACAINE HCL (PF) 0.5 % IJ SOLN
INTRAMUSCULAR | Status: DC | PRN
  Administered 2016-04-26: 10 mL

## 2016-04-26 MED ORDER — PROMETHAZINE HCL 25 MG/ML IJ SOLN: 6.2500 mg | INTRAMUSCULAR | Status: DC | PRN

## 2016-04-26 MED ORDER — BUPIVACAINE HCL (PF) 0.5 % IJ SOLN
INTRAMUSCULAR | Status: AC
  Filled 2016-04-26: qty 30

## 2016-04-26 MED ORDER — CELECOXIB 400 MG PO CAPS
400.0000 mg | ORAL_CAPSULE | ORAL | Status: AC
  Administered 2016-04-26: 400 mg via ORAL

## 2016-04-26 MED ORDER — MIDAZOLAM HCL 2 MG/2ML IJ SOLN
1.0000 mg | INTRAMUSCULAR | Status: DC | PRN
  Administered 2016-04-26: 2 mg via INTRAVENOUS

## 2016-04-26 MED ORDER — GABAPENTIN 300 MG PO CAPS
ORAL_CAPSULE | ORAL | Status: AC
  Filled 2016-04-26: qty 1

## 2016-04-26 MED ORDER — ACETAMINOPHEN 325 MG PO TABS: 650.0000 mg | ORAL_TABLET | ORAL | Status: DC | PRN

## 2016-04-26 MED ORDER — MIDAZOLAM HCL 2 MG/2ML IJ SOLN
INTRAMUSCULAR | Status: AC
  Filled 2016-04-26: qty 2

## 2016-04-26 MED ORDER — ACETAMINOPHEN 650 MG RE SUPP: 650.0000 mg | RECTAL | Status: DC | PRN

## 2016-04-26 MED ORDER — ACETAMINOPHEN 500 MG PO TABS
1000.0000 mg | ORAL_TABLET | ORAL | Status: AC
  Administered 2016-04-26: 1000 mg via ORAL

## 2016-04-26 MED ORDER — LIDOCAINE HCL (PF) 1 % IJ SOLN
INTRAMUSCULAR | Status: AC
  Filled 2016-04-26: qty 30

## 2016-04-26 MED ORDER — OXYCODONE HCL 5 MG PO TABS: 5.0000 mg | ORAL_TABLET | ORAL | Status: DC | PRN

## 2016-04-26 MED ORDER — FENTANYL CITRATE (PF) 100 MCG/2ML IJ SOLN
50.0000 ug | INTRAMUSCULAR | Status: DC | PRN
  Administered 2016-04-26: 100 ug via INTRAVENOUS

## 2016-04-26 MED ORDER — MORPHINE SULFATE (PF) 2 MG/ML IV SOLN: 2.0000 mg | INTRAVENOUS | Status: DC | PRN

## 2016-04-26 MED ORDER — ACETAMINOPHEN 500 MG PO TABS
ORAL_TABLET | ORAL | Status: AC
  Filled 2016-04-26: qty 2

## 2016-04-26 MED ORDER — PROPOFOL 10 MG/ML IV BOLUS
INTRAVENOUS | Status: DC | PRN
  Administered 2016-04-26: 150 mg via INTRAVENOUS

## 2016-04-26 MED ORDER — ONDANSETRON HCL 4 MG/2ML IJ SOLN
INTRAMUSCULAR | Status: DC | PRN
  Administered 2016-04-26: 4 mg via INTRAVENOUS

## 2016-04-26 MED ORDER — HYDROCODONE-ACETAMINOPHEN 5-325 MG PO TABS: 1.0000 | ORAL_TABLET | ORAL | 0 refills | Status: AC | PRN

## 2016-04-26 MED ORDER — SCOPOLAMINE 1 MG/3DAYS TD PT72: 1.0000 | MEDICATED_PATCH | Freq: Once | TRANSDERMAL | Status: DC | PRN

## 2016-04-26 MED ORDER — DEXAMETHASONE SODIUM PHOSPHATE 4 MG/ML IJ SOLN
INTRAMUSCULAR | Status: DC | PRN
  Administered 2016-04-26: 10 mg via INTRAVENOUS

## 2016-04-26 SURGICAL SUPPLY — 58 items
ADH SKN CLS APL DERMABOND .7 (GAUZE/BANDAGES/DRESSINGS)
APL SKNCLS STERI-STRIP NONHPOA (GAUZE/BANDAGES/DRESSINGS)
APPLIER CLIP 9.375 MED OPEN (MISCELLANEOUS)
APR CLP MED 9.3 20 MLT OPN (MISCELLANEOUS)
BENZOIN TINCTURE PRP APPL 2/3 (GAUZE/BANDAGES/DRESSINGS) IMPLANT
BINDER BREAST LRG (GAUZE/BANDAGES/DRESSINGS) IMPLANT
BINDER BREAST MEDIUM (GAUZE/BANDAGES/DRESSINGS) IMPLANT
BINDER BREAST XLRG (GAUZE/BANDAGES/DRESSINGS) IMPLANT
BINDER BREAST XXLRG (GAUZE/BANDAGES/DRESSINGS) IMPLANT
BLADE SURG 15 STRL LF DISP TIS (BLADE) ×1 IMPLANT
BLADE SURG 15 STRL SS (BLADE) ×3
CANISTER SUC SOCK COL 7IN (MISCELLANEOUS) IMPLANT
CANISTER SUCT 1200ML W/VALVE (MISCELLANEOUS) IMPLANT
CHLORAPREP W/TINT 26ML (MISCELLANEOUS) ×3 IMPLANT
CLIP APPLIE 9.375 MED OPEN (MISCELLANEOUS) IMPLANT
CLOSURE WOUND 1/2 X4 (GAUZE/BANDAGES/DRESSINGS)
COVER BACK TABLE 60X90IN (DRAPES) ×3 IMPLANT
COVER MAYO STAND STRL (DRAPES) ×3 IMPLANT
COVER PROBE W GEL 5X96 (DRAPES) ×3 IMPLANT
DECANTER SPIKE VIAL GLASS SM (MISCELLANEOUS) ×2 IMPLANT
DERMABOND ADVANCED (GAUZE/BANDAGES/DRESSINGS)
DERMABOND ADVANCED .7 DNX12 (GAUZE/BANDAGES/DRESSINGS) IMPLANT
DEVICE DUBIN W/COMP PLATE 8390 (MISCELLANEOUS) IMPLANT
DRAPE LAPAROTOMY 100X72 PEDS (DRAPES) ×3 IMPLANT
DRAPE UTILITY XL STRL (DRAPES) ×3 IMPLANT
DRSG TELFA 3X8 NADH (GAUZE/BANDAGES/DRESSINGS) ×3 IMPLANT
ELECT COATED BLADE 2.86 ST (ELECTRODE) ×3 IMPLANT
ELECT REM PT RETURN 9FT ADLT (ELECTROSURGICAL) ×3
ELECTRODE REM PT RTRN 9FT ADLT (ELECTROSURGICAL) ×1 IMPLANT
GAUZE SPONGE 4X4 12PLY STRL (GAUZE/BANDAGES/DRESSINGS) ×3 IMPLANT
GLOVE BIO SURGEON STRL SZ7.5 (GLOVE) ×4 IMPLANT
GLOVE BIOGEL PI IND STRL 8 (GLOVE) IMPLANT
GLOVE BIOGEL PI IND STRL 8.5 (GLOVE) ×1 IMPLANT
GLOVE BIOGEL PI INDICATOR 8 (GLOVE) ×2
GLOVE BIOGEL PI INDICATOR 8.5 (GLOVE) ×2
GLOVE ECLIPSE 8.0 STRL XLNG CF (GLOVE) ×3 IMPLANT
GOWN STRL REUS W/ TWL LRG LVL3 (GOWN DISPOSABLE) ×2 IMPLANT
GOWN STRL REUS W/TWL LRG LVL3 (GOWN DISPOSABLE) ×6
KIT MARKER MARGIN INK (KITS) ×3 IMPLANT
NDL HYPO 25X1 1.5 SAFETY (NEEDLE) ×1 IMPLANT
NEEDLE HYPO 25X1 1.5 SAFETY (NEEDLE) ×3 IMPLANT
NS IRRIG 1000ML POUR BTL (IV SOLUTION) ×3 IMPLANT
PACK BASIN DAY SURGERY FS (CUSTOM PROCEDURE TRAY) ×3 IMPLANT
PAD DRESSING TELFA 3X8 NADH (GAUZE/BANDAGES/DRESSINGS) ×1 IMPLANT
PENCIL BUTTON HOLSTER BLD 10FT (ELECTRODE) ×3 IMPLANT
SLEEVE SCD COMPRESS KNEE MED (MISCELLANEOUS) ×3 IMPLANT
SPONGE GAUZE 4X4 12PLY STER LF (GAUZE/BANDAGES/DRESSINGS) ×3 IMPLANT
SPONGE LAP 4X18 X RAY DECT (DISPOSABLE) ×3 IMPLANT
STRIP CLOSURE SKIN 1/2X4 (GAUZE/BANDAGES/DRESSINGS) IMPLANT
SUT MON AB 4-0 PC3 18 (SUTURE) ×3 IMPLANT
SUT SILK 2 0 FS (SUTURE) ×3 IMPLANT
SUT VICRYL 3-0 CR8 SH (SUTURE) ×3 IMPLANT
SYR CONTROL 10ML LL (SYRINGE) ×3 IMPLANT
TOWEL OR 17X24 6PK STRL BLUE (TOWEL DISPOSABLE) ×3 IMPLANT
TOWEL OR NON WOVEN STRL DISP B (DISPOSABLE) ×3 IMPLANT
TUBE CONNECTING 20'X1/4 (TUBING)
TUBE CONNECTING 20X1/4 (TUBING) IMPLANT
YANKAUER SUCT BULB TIP NO VENT (SUCTIONS) IMPLANT

## 2016-04-26 NOTE — Interval H&P Note (Signed)
History and Physical Interval Note:  04/26/2016 7:25 AM  Stacey Hinton  has presented today for surgery, with the diagnosis of left breast mucocele  The various methods of treatment have been discussed with the patient and family. After consideration of risks, benefits and other options for treatment, the patient has consented to  Procedure(s): LEFT BREAST LUMPECTOMY WITH RADIOACTIVE SEED LOCALIZATION (Left) as a surgical intervention .  The patient's history has been reviewed, patient examined, no change in status, stable for surgery.  I have reviewed the patient's chart and labs.  Questions were answered to the patient's satisfaction.     Xavior Niazi Lenna Sciara

## 2016-04-26 NOTE — H&P (Signed)
Stacey Hinton DOB: [birth year] Married / Language: English / Race: Black or African American Female  History of Present Illness.  The patient is a 50 year old female.   Note:She was referred by Dr. Joneen Caraway because of a mucocele like lesion upper inner quadrant of left breast. This was discovered on her 3-D mammogram. A 6 mm lesion was noted in the upper outer quadrant with some new calcifications. Stereotactic biopsy was performed. Pathology demonstrated a mucocele like lesion and fibrocystic changes. Wider excision was recommended and she presents to discuss that. Her past medical history is notable for a malignant phyllodes tumor of the right breast as below. She states she has a fair amount of swelling at the biopsy site.  Right partial mastectomy and immediate reconstruction; postop XRT 07/04/14 for Malignant phyllodes tumor.  Problem List/Past Medical  MALIGNANT PHYLLODES TUMOR OF BREAST, RIGHT (C50.911)  Other Problems  Breast Cancer Unspecified Diagnosis  Past Surgical History  Breast Biopsy Right. Breast Mass; Local Excision Right. Breast Reconstruction Right. Cesarean Section - Multiple   Allergies  No Known Drug Allergies   Prior to Admission medications   Medication Sig Start Date End Date Taking? Authorizing Provider  amLODipine (NORVASC) 5 MG tablet Take 5 mg by mouth daily.   Yes Historical Provider, MD  losartan (COZAAR) 50 MG tablet Take 50 mg by mouth daily.   Yes Historical Provider, MD     Social History  Alcohol use Occasional alcohol use. Caffeine use Carbonated beverages, Coffee, Tea. No drug use Tobacco use Never smoker.  Family History  Hypertension Father, Mother.   Physical Exam  The physical exam findings are as follows: Note:General-she looks well and is in no acute distress  Right breast-radiation changes to skin, lateral scar, no palpable masses.  Left breast-soft, 5 cm firm, ecchymotic mass  superior aspect.  Lymph nodes-no cervical, supraclavicular, or axillary adenopathy.  Assessment & Plan   BREAST MASS IN FEMALE (N63) Impression: She has a left breast mucocele-like lesion with fibrocystic change. Some of these lesions have been upgraded to Dr. carcinoma in situ after wider excision and thus that is recommended. We discussed that and she is in agreement. We will need to wait about 3-4 weeks to allow the hematoma to resolve.  Plan: Left breast lumpectomy after radioactive seed localization. I have explained the procedure, risks, and aftercare to her. Risks include but are not limited to bleeding, infection, wound problems, seroma formation, cosmetic deformity, anesthesia, need for further procedures. She seems to [facility] and agrees with the plan.  Jackolyn Confer, MD

## 2016-04-26 NOTE — Anesthesia Postprocedure Evaluation (Signed)
Anesthesia Post Note  Patient: Stacey Hinton  Procedure(s) Performed: Procedure(s) (LRB): LEFT BREAST LUMPECTOMY WITH RADIOACTIVE SEED LOCALIZATION (Left)  Patient location during evaluation: PACU Anesthesia Type: General Level of consciousness: awake and alert Pain management: pain level controlled Vital Signs Assessment: post-procedure vital signs reviewed and stable Respiratory status: spontaneous breathing, nonlabored ventilation, respiratory function stable and patient connected to nasal cannula oxygen Cardiovascular status: blood pressure returned to baseline and stable Postop Assessment: no signs of nausea or vomiting Anesthetic complications: no    Last Vitals:  Vitals:   04/26/16 0900 04/26/16 0915  BP: (!) 155/79 (!) 156/84  Pulse: 67 68  Resp: 14 16  Temp:      Last Pain:  Vitals:   04/26/16 0636  TempSrc: Oral                 Zenaida Deed

## 2016-04-26 NOTE — Op Note (Signed)
Operative Note  MARVELOUS OETJEN female 50 y.o. 04/26/2016  PREOPERATIVE DX:  Left breast mucocele and fibrocystic change  POSTOPERATIVE DX:  Same  PROCEDURE:   Left  partial mastectomy after radioactive seed localization         Surgeon: [name]   Assistants: None  Anesthesia: General LMA anesthesia  Indications:   This is a 50 year old female who has a history of a malignant phyllodes tumor of the right breast. She underwent a mammogram and had an atypical lesion  Upper inner quadrant of the left breast. Biopsy was consistent with a mucocele and fibrocystic change. Because some mucoceles end up being upgraded to malignancies once excised, she now presents for excision.    Procedure Detail:  She underwent successful radioactive seed localization yesterday. She was seen in the holding room. The left breast was marked with my initials. She is brought to the operating room and placed supine on the operating table and a general anesthetic was administered. The left breast was sterilely prepped and draped. A timeout was performed.  Using the neoprobe I identified the area of highest counts in the upper inner quadrant of left breast. Local anesthetic was infiltrated and a curvilinear incision was made in the upper inner quadrant. Subcutaneous tissue flaps were raised in all directions being guided by the neoprobe and keeping the lesion in the center of the partial mastectomy specimen. I then dissected this free from the chest wall down to the muscle as the lesion was deep. Once specimen was removed, the margins were inked. There is no residual radioactivity in the partial mastectomy wound.  Specimen mammogram was performed. This demonstrated the radioactive seeds, marking clip, and lesion with grossly adequate margins. This was sent to pathology.  Pathology confirmed retrieval of the seed.  Bleeding from the area was controlled with electrocautery. Marcaine was injected into the  subcutaneous tissues for local anesthetic effect.Subcutaneous tissue was then approximated with interrupted 3-0 Vicryl sutures. The skin was closed with a running 4-0 Monocryl subcuticular stitch. Steri-Strips and sterile dressings were applied.  She tolerated the procedure without any apparent complications and was taken to the recovery in satisfactory condition.  Estimated Blood Loss:  100 ml         Specimens: left breast tissue        Complications:  * No complications entered in OR log *         Disposition: PACU - hemodynamically stable.         Condition: stable

## 2016-04-26 NOTE — Transfer of Care (Signed)
Immediate Anesthesia Transfer of Care Note  Patient: Stacey Hinton  Procedure(s) Performed: Procedure(s): LEFT BREAST LUMPECTOMY WITH RADIOACTIVE SEED LOCALIZATION (Left)  Patient Location: PACU  Anesthesia Type:General  Level of Consciousness: sedated and patient cooperative  Airway & Oxygen Therapy: Patient Spontanous Breathing and Patient connected to face mask oxygen  Post-op Assessment: Report given to RN and Post -op Vital signs reviewed and stable  Post vital signs: Reviewed and stable  Last Vitals:  Vitals:   04/26/16 0636  BP: 104/82  Pulse: 82  Resp: 16  Temp: 36.7 C    Last Pain:  Vitals:   04/26/16 0636  TempSrc: Oral      Patients Stated Pain Goal: 0 ([id number])  Complications: No apparent anesthesia complications

## 2016-04-26 NOTE — Discharge Instructions (Signed)
[facility] Office Phone Number [phone]  BREAST BIOPSY/ PARTIAL MASTECTOMY: POST OP INSTRUCTIONS  Always review your discharge instruction sheet given to you by the facility where your surgery was performed.  IF YOU HAVE DISABILITY OR FAMILY LEAVE FORMS, YOU MUST BRING THEM TO THE OFFICE FOR PROCESSING.  DO NOT GIVE THEM TO YOUR DOCTOR.  1. A prescription for pain medication may be given to you upon discharge.  Take your pain medication as prescribed, if needed.  If narcotic pain medicine is not needed, then you may take acetaminophen (Tylenol) or ibuprofen (Advil) as needed. 2. Take your usually prescribed medications unless otherwise directed 3. If you need a refill on your pain medication, please contact your pharmacy.  They will contact our office to request authorization.  Prescriptions will not be filled after 5pm or on week-ends. 4. You should eat very light the first 24 hours after surgery, such as soup, crackers, pudding, etc.  Resume your normal diet the day after surgery. 5. Most patients will experience some swelling and bruising in the breast.  Ice packs and a good support bra will help.  Swelling and bruising can take several days to resolve.  6. It is common to experience some constipation if taking pain medication after surgery.  Increasing fluid intake and taking a stool softener will usually help or prevent this problem from occurring.  A mild laxative (Milk of Magnesia or Miralax) should be taken according to package directions if there are no bowel movements after 48 hours. 7. Unless discharge instructions indicate otherwise, you may remove your bandages 48 hours after surgery, and you may shower at that time.  You may have steri-strips (small skin tapes) in place directly over the incision.  These strips should be left on the skin for 14 days.  If your surgeon used skin glue on the incision, you may shower in 24 hours.  The glue will flake off over the next  2-3 weeks.  Any sutures or staples will be removed at the office during your follow-up visit. 8. ACTIVITIES:  You may resume light daily activities (gradually increasing) beginning the next day. Resume normal activities when pain-free.  Wearing a good support bra or sports bra minimizes pain and swelling.  You may have sexual intercourse when it is comfortable. a. You may drive when you no longer are taking prescription pain medication, you can comfortably wear a seatbelt, and you can safely maneuver your car and apply brakes. b. RETURN TO WORK:  _3-5 days when comfortable._____________________________________________________________________________________ 9. You should see your doctor in the office for a follow-up appointment approximately two weeks after your surgery.    Expect your pathology report 3 business days after your surgery.  You may call to check if you do not hear from [place] after three days. 10. OTHER INSTRUCTIONS: _______________________________________________________________________________________________ _____________________________________________________________________________________________________________________________________ _____________________________________________________________________________________________________________________________________ _____________________________________________________________________________________________________________________________________  WHEN TO CALL YOUR DOCTOR: 1. Fever over 101.0 2. Nausea and/or vomiting. 3. Extreme swelling or bruising. 4. Continued bleeding from incision. 5. Increased pain, redness, or drainage from the incision.  The clinic staff is available to answer your questions during regular business hours.  Please dont hesitate to call and ask to speak to one of the nurses for clinical concerns.  If you have a medical emergency, go to the nearest emergency room or call 911.  A surgeon from [facility] is always on call at the hospital.  For further questions, please visit [email or web address]    Post Anesthesia Home Care Instructions  Activity: Get plenty of rest for the remainder of the day. A responsible adult should stay with you for 24 hours following the procedure.  For the next 24 hours, DO NOT: -Drive a car -Paediatric nurse -Drink alcoholic beverages -Take any medication unless instructed by your physician -Make any legal decisions or sign important papers.  Meals: Start with liquid foods such as gelatin or soup. Progress to regular foods as tolerated. Avoid greasy, spicy, heavy foods. If nausea and/or vomiting occur, drink only clear liquids until the nausea and/or vomiting subsides. Call your physician if vomiting continues.  Special Instructions/Symptoms: Your throat may feel dry or sore from the anesthesia or the breathing tube placed in your throat during surgery. If this causes discomfort, gargle with warm salt water. The discomfort should disappear within 24 hours.  If you had a scopolamine patch placed behind your ear for the management of post- operative nausea and/or vomiting:  1. The medication in the patch is effective for 72 hours, after which it should be removed.  Wrap patch in a tissue and discard in the trash. Wash hands thoroughly with soap and water. 2. You may remove the patch earlier than 72 hours if you experience unpleasant side effects which may include dry mouth, dizziness or visual disturbances. 3. Avoid touching the patch. Wash your hands with soap and water after contact with the patch.

## 2016-04-26 NOTE — Anesthesia Procedure Notes (Signed)
Procedure Name: LMA Insertion Date/Time: 04/26/2016 7:39 AM Performed by: Lyndee Leo Pre-anesthesia Checklist: Patient identified, Emergency Drugs available, Suction available and Patient being monitored Patient Re-evaluated:Patient Re-evaluated prior to inductionOxygen Delivery Method: Circle system utilized Preoxygenation: Pre-oxygenation with 100% oxygen Intubation Type: IV induction Ventilation: Mask ventilation without difficulty LMA: LMA inserted LMA Size: 4.0 Number of attempts: 1 Airway Equipment and Method: Bite block Placement Confirmation: positive ETCO2 Tube secured with: Tape Dental Injury: Teeth and Oropharynx as per pre-operative assessment

## 2016-04-26 NOTE — Anesthesia Preprocedure Evaluation (Addendum)
Anesthesia Evaluation  Patient identified by MRN, date of birth, ID band Patient awake    Reviewed: Allergy & Precautions, H&P , NPO status , Patient's Chart, lab work & pertinent test results  Airway Mallampati: II   Neck ROM: full    Dental  (+) Teeth Intact   Pulmonary neg pulmonary ROS,    breath sounds clear to auscultation       Cardiovascular hypertension, Pt. on medications  Rhythm:Regular Rate:Normal     Neuro/Psych    GI/Hepatic   Endo/Other    Renal/GU      Musculoskeletal   Abdominal   Peds  Hematology   Anesthesia Other Findings   Reproductive/Obstetrics                            Anesthesia Physical  Anesthesia Plan  ASA: II  Anesthesia Plan: General   Post-op Pain Management:    Induction: Intravenous  Airway Management Planned: LMA  Additional Equipment:   Intra-op Plan:   Post-operative Plan: Extubation in OR  Informed Consent: I have reviewed the patients History and Physical, chart, labs and discussed the procedure including the risks, benefits and alternatives for the proposed anesthesia with the patient or authorized representative who has indicated his/her understanding and acceptance.     Plan Discussed with: CRNA, Anesthesiologist and Surgeon  Anesthesia Plan Comments:         Anesthesia Quick Evaluation

## 2016-04-29 ENCOUNTER — Encounter (HOSPITAL_BASED_OUTPATIENT_CLINIC_OR_DEPARTMENT_OTHER): Payer: Self-pay | Admitting: General Surgery

## 2016-08-06 ENCOUNTER — Other Ambulatory Visit: Payer: 59

## 2016-08-13 ENCOUNTER — Ambulatory Visit: Payer: 59 | Admitting: Oncology

## 2016-08-14 ENCOUNTER — Encounter: Payer: Self-pay | Admitting: Oncology

## 2017-02-13 ENCOUNTER — Other Ambulatory Visit: Payer: Self-pay | Admitting: Obstetrics and Gynecology

## 2017-02-13 DIAGNOSIS — Z853 Personal history of malignant neoplasm of breast: Secondary | ICD-10-CM

## 2017-02-25 ENCOUNTER — Ambulatory Visit
Admission: RE | Admit: 2017-02-25 | Discharge: 2017-02-25 | Disposition: A | Discharge status: Home / Self Care | Payer: 59 | Source: Ambulatory Visit | Attending: Obstetrics and Gynecology | Admitting: Obstetrics and Gynecology

## 2017-02-25 DIAGNOSIS — Z853 Personal history of malignant neoplasm of breast: Secondary | ICD-10-CM

## 2018-05-05 ENCOUNTER — Other Ambulatory Visit: Payer: Self-pay | Admitting: Obstetrics and Gynecology

## 2018-05-05 DIAGNOSIS — Z9889 Other specified postprocedural states: Secondary | ICD-10-CM

## 2018-05-05 DIAGNOSIS — Z1231 Encounter for screening mammogram for malignant neoplasm of breast: Secondary | ICD-10-CM

## 2018-05-13 ENCOUNTER — Ambulatory Visit
Admission: RE | Admit: 2018-05-13 | Discharge: 2018-05-13 | Disposition: A | Discharge status: Home / Self Care | Payer: 59 | Source: Ambulatory Visit | Attending: Obstetrics and Gynecology | Admitting: Obstetrics and Gynecology

## 2018-05-13 DIAGNOSIS — Z9889 Other specified postprocedural states: Secondary | ICD-10-CM

## 2018-05-13 DIAGNOSIS — Z1231 Encounter for screening mammogram for malignant neoplasm of breast: Secondary | ICD-10-CM

## 2019-04-26 ENCOUNTER — Other Ambulatory Visit: Payer: Self-pay | Admitting: Obstetrics and Gynecology

## 2019-04-26 DIAGNOSIS — Z853 Personal history of malignant neoplasm of breast: Secondary | ICD-10-CM

## 2019-05-18 ENCOUNTER — Ambulatory Visit
Admission: RE | Admit: 2019-05-18 | Discharge: 2019-05-18 | Disposition: A | Discharge status: Home / Self Care | Payer: 59 | Source: Ambulatory Visit | Attending: Obstetrics and Gynecology | Admitting: Obstetrics and Gynecology

## 2019-05-18 ENCOUNTER — Other Ambulatory Visit: Payer: Self-pay

## 2019-05-18 DIAGNOSIS — Z853 Personal history of malignant neoplasm of breast: Secondary | ICD-10-CM

## 2019-12-30 ENCOUNTER — Ambulatory Visit: Payer: BC Managed Care – PPO

## 2019-12-30 ENCOUNTER — Ambulatory Visit: Payer: BC Managed Care – PPO | Attending: Internal Medicine

## 2019-12-30 DIAGNOSIS — Z23 Encounter for immunization: Secondary | ICD-10-CM

## 2019-12-30 NOTE — Progress Notes (Signed)
   Covid-19 Vaccination Clinic  Name:  JAMEE LOBATOS    MRN: [id number] DOB: 05-Jun-1966  12/30/2019  Ms. Raymer was observed post Covid-19 immunization for 15 minutes without incident. She was provided with Vaccine Information Sheet and instruction to access the V-Safe system.   Ms. Pruski was instructed to call 911 with any severe reactions post vaccine: Marland Kitchen Difficulty breathing  . Swelling of face and throat  . A fast heartbeat  . A bad rash all over body  . Dizziness and weakness   Immunizations Administered    Name Date Dose VIS Date Route   Pfizer COVID-19 Vaccine 12/30/2019  2:34 PM 0.3 mL 09/10/2019 Intramuscular   Manufacturer: [place]   Lot: [id number]   [place]: [id number]    pt reported no issues

## 2020-01-24 ENCOUNTER — Ambulatory Visit: Payer: BC Managed Care – PPO | Attending: Internal Medicine

## 2020-01-24 DIAGNOSIS — Z23 Encounter for immunization: Secondary | ICD-10-CM

## 2020-01-24 NOTE — Progress Notes (Signed)
   Covid-19 Vaccination Clinic  Name:  Stacey Hinton    MRN: [id number] DOB: 05/08/1966  01/24/2020  [name] was observed post Covid-19 immunization for 15 minutes without incident. She was provided with Vaccine Information Sheet and instruction to access the V-Safe system.   [name] was instructed to call 911 with any severe reactions post vaccine: Marland Kitchen Difficulty breathing  . Swelling of face and throat  . A fast heartbeat  . A bad rash all over body  . Dizziness and weakness   Immunizations Administered    Name Date Dose VIS Date Route   Pfizer COVID-19 Vaccine 01/24/2020  1:26 PM 0.3 mL 11/24/2018 Intramuscular   Manufacturer: [place]   Lot: [id number]   [place]: [id number]

## 2020-05-02 ENCOUNTER — Other Ambulatory Visit: Payer: Self-pay | Admitting: Obstetrics and Gynecology

## 2020-05-02 DIAGNOSIS — Z1231 Encounter for screening mammogram for malignant neoplasm of breast: Secondary | ICD-10-CM

## 2020-05-18 ENCOUNTER — Other Ambulatory Visit: Payer: Self-pay

## 2020-05-18 ENCOUNTER — Ambulatory Visit
Admission: RE | Admit: 2020-05-18 | Discharge: 2020-05-18 | Disposition: A | Discharge status: Home / Self Care | Payer: BC Managed Care – PPO | Source: Ambulatory Visit | Attending: Obstetrics and Gynecology | Admitting: Obstetrics and Gynecology

## 2020-05-18 DIAGNOSIS — Z1231 Encounter for screening mammogram for malignant neoplasm of breast: Secondary | ICD-10-CM

## 2020-09-30 DIAGNOSIS — U071 COVID-19: Secondary | ICD-10-CM

## 2020-09-30 HISTORY — DX: COVID-19: U07.1

## 2020-10-13 ENCOUNTER — Other Ambulatory Visit: Payer: BC Managed Care – PPO

## 2020-10-13 DIAGNOSIS — Z20822 Contact with and (suspected) exposure to covid-19: Secondary | ICD-10-CM

## 2020-10-15 LAB — NOVEL CORONAVIRUS, NAA: SARS-CoV-2, NAA: DETECTED — AB

## 2020-10-15 LAB — SARS-COV-2, NAA 2 DAY TAT

## 2020-10-16 ENCOUNTER — Telehealth: Payer: Self-pay | Admitting: Nurse Practitioner

## 2020-10-16 ENCOUNTER — Encounter: Payer: Self-pay | Admitting: Nurse Practitioner

## 2020-10-16 DIAGNOSIS — I10 Essential (primary) hypertension: Secondary | ICD-10-CM | POA: Insufficient documentation

## 2020-10-16 NOTE — Telephone Encounter (Signed)
I called Stacey Hinton to discuss Covid symptoms and the use of Sotrovimab, a monoclonal antibody infusion for those with mild to moderate Covid symptoms and at a high risk of hospitalization.     She is feeling much better - only mild sinus congestion at thist poin.  Pt does not qualify for infusion therapy as pt's symptoms first presented > 7 days prior to timing of infusion. Symptoms tier reviewed as well as criteria for ending isolation. Preventative practices reviewed. Patient verbalized understanding   Patient Active Problem List   Diagnosis Date Noted  . Hypertension   . COVID-19 virus infection 09/2020  . 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

## 2021-04-23 ENCOUNTER — Other Ambulatory Visit: Payer: Self-pay | Admitting: Family Medicine

## 2021-04-23 DIAGNOSIS — Z1231 Encounter for screening mammogram for malignant neoplasm of breast: Secondary | ICD-10-CM

## 2021-06-13 ENCOUNTER — Ambulatory Visit
Admission: RE | Admit: 2021-06-13 | Discharge: 2021-06-13 | Disposition: A | Discharge status: Home / Self Care | Payer: BC Managed Care – PPO | Source: Ambulatory Visit | Attending: Family Medicine | Admitting: Family Medicine

## 2021-06-13 ENCOUNTER — Other Ambulatory Visit: Payer: Self-pay

## 2021-06-13 DIAGNOSIS — Z1231 Encounter for screening mammogram for malignant neoplasm of breast: Secondary | ICD-10-CM

## 2022-05-22 ENCOUNTER — Other Ambulatory Visit: Payer: Self-pay | Admitting: Family Medicine

## 2022-05-22 DIAGNOSIS — Z1231 Encounter for screening mammogram for malignant neoplasm of breast: Secondary | ICD-10-CM

## 2022-06-14 ENCOUNTER — Ambulatory Visit: Payer: BC Managed Care – PPO

## 2022-06-20 ENCOUNTER — Ambulatory Visit
Admission: RE | Admit: 2022-06-20 | Discharge: 2022-06-20 | Disposition: A | Discharge status: Home / Self Care | Payer: BC Managed Care – PPO | Source: Ambulatory Visit | Attending: Family Medicine | Admitting: Family Medicine

## 2022-06-20 ENCOUNTER — Ambulatory Visit
Admission: RE | Admit: 2022-06-20 | Discharge: 2022-06-20 | Disposition: A | Discharge status: Home / Self Care | Payer: 59 | Source: Ambulatory Visit | Attending: Family Medicine | Admitting: Family Medicine

## 2022-06-20 DIAGNOSIS — Z1231 Encounter for screening mammogram for malignant neoplasm of breast: Secondary | ICD-10-CM

## 2023-07-22 ENCOUNTER — Ambulatory Visit
Admission: RE | Admit: 2023-07-22 | Discharge: 2023-07-22 | Disposition: A | Discharge status: Home / Self Care | Payer: 59 | Source: Ambulatory Visit | Attending: Family Medicine | Admitting: Family Medicine

## 2023-07-22 ENCOUNTER — Other Ambulatory Visit: Payer: Self-pay | Admitting: Family Medicine

## 2023-07-22 DIAGNOSIS — Z0000 Encounter for general adult medical examination without abnormal findings: Secondary | ICD-10-CM

## 2024-07-19 ENCOUNTER — Other Ambulatory Visit: Payer: Self-pay | Admitting: Family Medicine

## 2024-07-19 DIAGNOSIS — Z1231 Encounter for screening mammogram for malignant neoplasm of breast: Secondary | ICD-10-CM

## 2024-08-04 ENCOUNTER — Ambulatory Visit
Admission: RE | Admit: 2024-08-04 | Discharge: 2024-08-04 | Disposition: A | Discharge status: Home / Self Care | Source: Ambulatory Visit | Attending: Family Medicine | Admitting: Family Medicine

## 2024-08-04 DIAGNOSIS — Z1231 Encounter for screening mammogram for malignant neoplasm of breast: Secondary | ICD-10-CM

## 2024-08-09 ENCOUNTER — Other Ambulatory Visit: Payer: Self-pay | Admitting: Family Medicine

## 2024-08-09 DIAGNOSIS — R928 Other abnormal and inconclusive findings on diagnostic imaging of breast: Secondary | ICD-10-CM

## 2024-08-19 ENCOUNTER — Ambulatory Visit
Admission: RE | Admit: 2024-08-19 | Discharge: 2024-08-19 | Disposition: A | Discharge status: Home / Self Care | Source: Ambulatory Visit | Attending: Family Medicine | Admitting: Family Medicine

## 2024-08-19 DIAGNOSIS — R928 Other abnormal and inconclusive findings on diagnostic imaging of breast: Secondary | ICD-10-CM
